# Patient Record
Sex: Male | Born: 1960 | Race: White | Hispanic: No | Marital: Married | Smoking: Never smoker
Health system: Southern US, Community
[De-identification: ages and names within clinical notes are randomized; demographics above are authoritative.]

## PROBLEM LIST (undated history)

## (undated) DIAGNOSIS — B019 Varicella without complication: Secondary | ICD-10-CM

## (undated) DIAGNOSIS — K219 Gastro-esophageal reflux disease without esophagitis: Secondary | ICD-10-CM

## (undated) DIAGNOSIS — G43909 Migraine, unspecified, not intractable, without status migrainosus: Secondary | ICD-10-CM

## (undated) DIAGNOSIS — H9190 Unspecified hearing loss, unspecified ear: Secondary | ICD-10-CM

## (undated) DIAGNOSIS — B029 Zoster without complications: Secondary | ICD-10-CM

## (undated) DIAGNOSIS — N200 Calculus of kidney: Secondary | ICD-10-CM

## (undated) DIAGNOSIS — N400 Enlarged prostate without lower urinary tract symptoms: Secondary | ICD-10-CM

## (undated) DIAGNOSIS — K439 Ventral hernia without obstruction or gangrene: Secondary | ICD-10-CM

## (undated) DIAGNOSIS — G8929 Other chronic pain: Secondary | ICD-10-CM

## (undated) HISTORY — DX: Zoster without complications: B02.9

## (undated) HISTORY — DX: Migraine, unspecified, not intractable, without status migrainosus: G43.909

## (undated) HISTORY — DX: Gastro-esophageal reflux disease without esophagitis: K21.9

## (undated) HISTORY — PX: BACK SURGERY: SHX140

## (undated) HISTORY — PX: LUMBAR DISC SURGERY: SHX700

## (undated) HISTORY — DX: Unspecified hearing loss, unspecified ear: H91.90

## (undated) HISTORY — PX: SPINE SURGERY: SHX786

## (undated) HISTORY — DX: Benign prostatic hyperplasia without lower urinary tract symptoms: N40.0

## (undated) HISTORY — DX: Calculus of kidney: N20.0

## (undated) HISTORY — DX: Varicella without complication: B01.9

## (undated) HISTORY — DX: Ventral hernia without obstruction or gangrene: K43.9

---

## 2002-02-09 ENCOUNTER — Ambulatory Visit (HOSPITAL_COMMUNITY): Admission: RE | Admit: 2002-02-09 | Discharge: 2002-02-10 | Payer: Self-pay | Admitting: Neurosurgery

## 2004-07-13 ENCOUNTER — Ambulatory Visit: Payer: Self-pay | Admitting: Family Medicine

## 2004-07-31 ENCOUNTER — Ambulatory Visit: Payer: Self-pay | Admitting: Internal Medicine

## 2006-05-10 ENCOUNTER — Ambulatory Visit: Payer: Self-pay | Admitting: Family Medicine

## 2006-05-10 LAB — CONVERTED CEMR LAB
ALT: 16 units/L (ref 0–53)
AST: 16 units/L (ref 0–37)
Albumin: 4.8 g/dL (ref 3.5–5.2)
Alkaline Phosphatase: 75 units/L (ref 39–117)
BUN: 15 mg/dL (ref 6–23)
Basophils Absolute: 0 10*3/uL (ref 0.0–0.1)
Basophils Relative: 0 % (ref 0–1)
Bilirubin, Direct: 0.1 mg/dL (ref 0.0–0.3)
CO2: 25 meq/L (ref 19–32)
Calcium: 9.4 mg/dL (ref 8.4–10.5)
Chloride: 102 meq/L (ref 96–112)
Cholesterol: 143 mg/dL (ref 0–200)
Creatinine, Ser: 0.97 mg/dL (ref 0.40–1.50)
Eosinophils Absolute: 0.1 10*3/uL (ref 0.0–0.7)
Eosinophils Relative: 1 % (ref 0–5)
Glucose, Bld: 87 mg/dL (ref 70–99)
HCT: 44.9 % (ref 39.0–52.0)
HDL: 44 mg/dL (ref >39)
Hemoglobin: 15.5 g/dL (ref 13.0–17.0)
Indirect Bilirubin: 0.5 mg/dL (ref 0.0–0.9)
LDL Cholesterol: 84 mg/dL (ref 0–99)
Lymphocytes Relative: 27 % (ref 12–46)
Lymphs Abs: 1.8 10*3/uL (ref 0.7–3.3)
MCHC: 34.5 g/dL (ref 30.0–36.0)
MCV: 96.1 fL (ref 78.0–100.0)
Monocytes Absolute: 0.8 10*3/uL — ABNORMAL HIGH (ref 0.2–0.7)
Monocytes Relative: 13 % — ABNORMAL HIGH (ref 3–11)
Neutro Abs: 3.8 10*3/uL (ref 1.7–7.7)
Neutrophils Relative %: 59 % (ref 43–77)
PSA: 0.57 ng/mL (ref 0.10–4.00)
Platelets: 224 10*3/uL (ref 150–400)
Potassium: 4.3 meq/L (ref 3.5–5.3)
RBC: 4.67 M/uL (ref 4.22–5.81)
RDW: 12.6 % (ref 11.5–14.0)
Sodium: 141 meq/L (ref 135–145)
TSH: 1.983 microintl units/mL (ref 0.350–5.50)
Total Bilirubin: 0.6 mg/dL (ref 0.3–1.2)
Total CHOL/HDL Ratio: 3.3
Total Protein: 7.9 g/dL (ref 6.0–8.3)
Triglycerides: 74 mg/dL (ref <150)
VLDL: 15 mg/dL (ref 0–40)
WBC: 6.5 10*3/uL (ref 4.0–10.5)

## 2006-05-13 ENCOUNTER — Encounter: Payer: Self-pay | Admitting: Family Medicine

## 2006-05-13 LAB — CONVERTED CEMR LAB: PSA: 0.57 ng/mL

## 2006-05-22 ENCOUNTER — Encounter: Payer: Self-pay | Admitting: Family Medicine

## 2006-09-11 ENCOUNTER — Ambulatory Visit: Payer: Self-pay | Admitting: Family Medicine

## 2006-09-11 DIAGNOSIS — Z87442 Personal history of urinary calculi: Secondary | ICD-10-CM

## 2006-09-11 DIAGNOSIS — L255 Unspecified contact dermatitis due to plants, except food: Secondary | ICD-10-CM

## 2006-09-11 DIAGNOSIS — N400 Enlarged prostate without lower urinary tract symptoms: Secondary | ICD-10-CM | POA: Insufficient documentation

## 2009-08-29 ENCOUNTER — Encounter (INDEPENDENT_AMBULATORY_CARE_PROVIDER_SITE_OTHER): Payer: Self-pay | Admitting: *Deleted

## 2010-02-21 NOTE — Letter (Signed)
Summary: Richard Salazar letter  Richard Salazar at [facility]  [address] [place], [place] [address]   Phone: [phone]  Fax: [phone]       08/29/2009 MRN: [id number]  [facility] [address] [place], [place]  [address]  Dear Mr. Richard Salazar [facility] [place], and [place] announce the retirement of Richard Salazar, M.D., from full-time practice at the [facility] office effective July 21, 2009 and his plans of returning part-time.  It is important to Richard Salazar and to our practice that you understand that Richard Salazar, Richard Salazar has seven physicians in our office for your health care needs.  We will continue to offer the same exceptional care that you have today.    Richard Salazar has spoken to many of you about his plans for retirement and returning part-time in the fall.   We will continue to work with you through the transition to schedule appointments for you in the office and meet the high standards that Richard Salazar is committed to.   Again, it is with great pleasure that we share the news that Richard Salazar will return to [facility] [place] [facility] in October of 2011 with a reduced schedule.    If you have any questions, or would like to request an appointment with one of our physicians, please call us at [phone] and press the option for Scheduling an appointment.  We take pleasure in providing you with excellent patient care and look forward to seeing you at your next office visit.  Our [place] [facility] Physicians are:  Richard Salazar, M.D. Richard Salazar, M.D. Richard Salazar, M.D. Richard Salazar, M.D. Richard Salazar, M.D. Richard Salazar, M.D. We proudly welcomed Richard Salazar, M.D. and Richard Salazar, M.D. to the practice in July/August 2011.  Sincerely,  Richard [facility]azar [facility]

## 2010-06-09 NOTE — Op Note (Signed)
NAME:  Richard Salazar, Richard Salazar NO.:  [id number]   MEDICAL RECORD NO.:  [id number]                   PATIENT TYPE:  OIB   LOCATION:  3009                                 FACILITY:  [facility]   PHYSICIAN:  Cristi Loron, M.D.            DATE OF BIRTH:  [birth year]   DATE OF PROCEDURE:  02/09/2002  DATE OF DISCHARGE:                                 OPERATIVE REPORT   BRIEF HISTORY:  The patient is a 49 year old white male who has suffered  from back and right leg pain.  He failed medical management and was worked  up with a lumbar MRI which demonstrated a large herniated disk at L4-5 on  the right.  The patient's signs and symptoms and physical exam were  consistent with a right L4 and L5 radiculopathy.  I discussed the various  treatment options with the patient including surgery.  The patient weighed  the risks, benefits, and alternatives to surgery and decided to proceed with  the operation.   PREOPERATIVE DIAGNOSES:  1. 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  2. Stenosis.  3. Degenerative disease.  4. Lumbar radiculopathy.  5. Lumbago.   POSTOPERATIVE DIAGNOSES:  1. 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  2. Stenosis.  3. Degenerative disease.  4. Lumbar radiculopathy.  5. Lumbago.   PROCEDURE:  Right L4 hemilaminectomy for right L4-5 microdiskectomy using  microdissection.   SURGEON:  Cristi Loron, M.D.   ASSISTANT:  None.   ANESTHESIA:  General endotracheal.   ESTIMATED BLOOD LOSS:  Minimal.   SPECIMENS:  None.   DRAINS:  None.   COMPLICATIONS:  None.   DESCRIPTION OF PROCEDURE:  The patient was brought to the operating room by  ICU team.  General endotracheal anesthesia was induced.  The patient was  then turned to the prone position on the Wilson frame.  His lumbosacral  region had been prepared with Betadine scrub and Betadine solution.  Sterile  drapes were applied.  I then injected the opening sides with Marcaine with  epinephrine solution.  I used a scalpel to make a linear midline incision  over the L4-5 interspace.  I used electrocautery to dissect down to the  thoracolumbar fascia and then divided the fascia just to the right of  midline performing a right sciatic subperiosteal dissection stripped to the  paraspinous musculature to the reverse spinous process to the lamina of L4  and L5.  I inserted the McCauley rectractor for exposure and then obtained  intraoperative radiography for primary location.   We then felt brought the operating microscope into the field and under its  magnification and illumination, completed the microdissection/decompression.  I used the high-speed drill to perform a right L4 laminotomy.  I widened the  laminotomy with a crescent punch, completing a right L4 hemilaminectomy.  I  ruled the right L3-4 and L4-5 fragment with the crescent punch, giving wide  exposure to the thecal sac.  I then  performed a foraminotomy about the right  L5 and L4 nerve root.  I then carefully dissected through the epidural  tissue freeing up the thecal sac and the right L4 and L5 nerve roots  underneath the thecal sac where the huge retrodisk which had migrated in a  cephalad direction from the L4-5 intervertebral disk space.  I freed up this  large herniated disk with microdissectors, and removed it with the pituitary  forceps in several large fragments.  It is currently compressed at proximal  thecal sac and the exiting right L4 nerve root.  I then inspected the right  L4-5 nerve root and the vertebral disk.  There was bulging considerably, but  there was no significant neural compression.  I inspected the annulus.  There was a hole in the annulus, somewhat medially, but there were no  impending disk herniations and therefore, I did not violate the annulus  fibrosis and I did not enter into the interspace.  I then palpated along the  ventral surface of the thecal sac and along the __________  of the L5 and L4  nerve roots, noting that the neural structures were well decompressed.  I  achieved extremely good hemostasis using bipolar electrocautery.  I  copiously irrigated the wound out with bacitracin solution, removed the  solution and then removed the McCullough retractor and then reapproximated  the patient's thoracolumbar fascia with interrupted #1 Vicryl suture  subcutaneously 2-0 Vicryl suture and the skin was steri-stripped and  Benzoin.  The wound was then coated with Bacitracin ointment.  A sterile  dressing applied.  The drains were removed.  The patient was subsequently  returned to the supine position where he was extubated by the anesthesia  team and transported to the postanesthesia care unit in stable condition.  All sponge,  instrument, and needle counts are correct at the end of case.                                               Cristi Loron, M.D.    JDJ/MEDQ  D:  02/09/2002  T:  02/09/2002  Job:  [id number]

## 2011-06-29 ENCOUNTER — Emergency Department: Payer: Self-pay | Admitting: Emergency Medicine

## 2011-06-29 LAB — TROPONIN I: Troponin-I: <0.02 ng/mL

## 2011-06-29 LAB — COMPREHENSIVE METABOLIC PANEL
Albumin: 4.1 g/dL (ref 3.4–5.0)
Anion Gap: 9 (ref 7–16)
BUN: 15 mg/dL (ref 7–18)
Bilirubin,Total: 0.4 mg/dL (ref 0.2–1.0)
Calcium, Total: 9.1 mg/dL (ref 8.5–10.1)
Chloride: 106 mmol/L (ref 98–107)
Creatinine: 0.95 mg/dL (ref 0.60–1.30)
EGFR (Non-African Amer.): >60
Glucose: 89 mg/dL (ref 65–99)
Osmolality: 285 (ref 275–301)
Potassium: 4 mmol/L (ref 3.5–5.1)
SGPT (ALT): 20 U/L
Sodium: 143 mmol/L (ref 136–145)
Total Protein: 7.9 g/dL (ref 6.4–8.2)

## 2011-06-29 LAB — CBC
HGB: 14.9 g/dL (ref 13.0–18.0)
Platelet: 183 10*3/uL (ref 150–440)

## 2011-06-29 LAB — CK TOTAL AND CKMB (NOT AT ARMC): CK-MB: 0.5 ng/mL (ref 0.5–3.6)

## 2014-04-07 ENCOUNTER — Ambulatory Visit (INDEPENDENT_AMBULATORY_CARE_PROVIDER_SITE_OTHER): Payer: No Typology Code available for payment source | Admitting: Primary Care

## 2014-04-07 ENCOUNTER — Encounter: Payer: Self-pay | Admitting: Primary Care

## 2014-04-07 VITALS — BP 134/86 | HR 88 | Temp 98.7°F | Ht 68.0 in | Wt 189.4 lb

## 2014-04-07 DIAGNOSIS — M25511 Pain in right shoulder: Secondary | ICD-10-CM

## 2014-04-07 DIAGNOSIS — Z1211 Encounter for screening for malignant neoplasm of colon: Secondary | ICD-10-CM | POA: Diagnosis not present

## 2014-04-07 DIAGNOSIS — R51 Headache: Secondary | ICD-10-CM

## 2014-04-07 DIAGNOSIS — R21 Rash and other nonspecific skin eruption: Secondary | ICD-10-CM | POA: Diagnosis not present

## 2014-04-07 DIAGNOSIS — J3489 Other specified disorders of nose and nasal sinuses: Secondary | ICD-10-CM | POA: Diagnosis not present

## 2014-04-07 DIAGNOSIS — R519 Headache, unspecified: Secondary | ICD-10-CM | POA: Insufficient documentation

## 2014-04-07 MED ORDER — FLUTICASONE PROPIONATE 50 MCG/ACT NA SUSP: 2.0000 | Freq: Every day | NASAL | Status: DC

## 2014-04-07 NOTE — Assessment & Plan Note (Signed)
Present with adduction and abduction. Referred to Dr. Lorelei Pont for further evaluation and for possible physical therapy.

## 2014-04-07 NOTE — Progress Notes (Signed)
Pre visit review using our clinic review tool, if applicable. No additional management support is needed unless otherwise documented below in the visit note. 

## 2014-04-07 NOTE — Assessment & Plan Note (Signed)
Intermittently. Clear. 6-7 drops. RX for Flonase, recommended Claritin/Zyrtec. Will continue to monitor.

## 2014-04-07 NOTE — Assessment & Plan Note (Signed)
Stable, likely due to eye strain due to daily encounter with computer screen. Will be getting new RX for eye glasses which is likely the cause. Will continue to monitor.

## 2014-04-07 NOTE — Assessment & Plan Note (Signed)
Raised area with erythema. This is in the same location for which a mole was removed. Questionable appearance. Patient and wife would like referral to Dermatology. Referral made. Will continue to monitor.

## 2014-04-07 NOTE — Patient Instructions (Signed)
Take Claritin or Zyrtec for allergies and nasal drainage. Use Flonase nasal spray once daily for nasal drainage. You will be contacted regarding your referrals to Dermatology and Sports Medicine. Call me if you have any questions! Welcome to [facility]!

## 2014-04-07 NOTE — Progress Notes (Signed)
Subjective:    Patient ID: Richard Salazar, male    DOB: 1960-09-14, 54 y.o.   MRN: [id number]  HPI  [name] is a 54 year old male who presents today to establish care and with a chief complaint of nasal drainage.   1) Nasal drainage: He had an URI over the past week which has now mostly dissipated. This previous weekend he began feeling nauseated with dizziness, and started noticing rhinorrhea on Monday. The rhinorrhea is a sudden onset of drainage, particularly when he leans forward or turns his head laterally. The drainage is intermittent and mostly clear and drains from his left nostril. He will typically see about 6-7 drops before it will dissipate, but has seen at most a tablespoons amount.  On Monday morning he noticed that part of his pillow was wet due to the nasal discharge.  Neither he or his wife have not noticed any neuro changes, bleeding, or recent trauma. Monday night he did have a left sided migraine headache.  2) BPH: Diagnosed 10 years ago. Had frequency of urination at night. He's had no problems since. Not on any medications.  3) Shoulder pain: Right sided.pain for 1.5 years. He noticed this pain when he worked out pretty aggressively with weights. The pain is dull and achy with most movement but is much worse if he makes a jarring or sudden movement. Denies numbness/tingling. Doesn't take any medication, including OTC, for his pain.  4) Hip/Leg pain: Right sided, had discectomy due to bulging disc 12 years ago which may have been to L5 or L4, but he's uncertain. He will have moderate to severe pain if his leg "cuts a corner too sharp" or if he has a sudden change of direction. Denies numbness/tingling, but occasional weakness. Doesn't take any medication, including OTC, for his pain.  5) Headaches: Frequent and will them every 2-3 days. His occupation requires him to look at a computer screen daily for which he will strain his eyes. He has a new prescription for his  glasses and plans to update them soon. He doesn't typically take anything for his headaches, but will occasionally take Excedrin migraine which will east his pain. He was on daily medication in college.Took some medication in collage.   6) GERD: History of reflux with cough and was treated with Nexium. Symptoms resolved and was able to come off. No problems to date.  Review of Systems  Constitutional: Negative for fever and unexpected weight change.  HENT: Positive for postnasal drip and rhinorrhea. Negative for sore throat.   Eyes: Negative for itching.  Respiratory: Negative for cough and shortness of breath.   Cardiovascular: Negative for chest pain and leg swelling.  Gastrointestinal: Negative for abdominal pain, diarrhea and constipation.  Genitourinary: Negative for dysuria and frequency.  Musculoskeletal: Positive for arthralgias. Negative for back pain.  Skin: Negative for rash.  Neurological: Positive for headaches.  Hematological: Negative for adenopathy.  Psychiatric/Behavioral:       Denies anxiety and depression.        Past Medical History  Diagnosis Date  . BPH (benign prostatic hyperplasia)   . Deafness     Left ear, loss  . GERD (gastroesophageal reflux disease)   . Shingles     15 years ago    History   Social History  . Marital Status: Single    Spouse Name: N/A  . Number of Children: N/A  . Years of Education: N/A   Occupational History  . Not on  file.   Social History Main Topics  . Smoking status: Never Smoker   . Smokeless tobacco: Not on file  . Alcohol Use: 0.0 oz/week    0 Standard drinks or equivalent per week     Comment: occ  . Drug Use: No  . Sexual Activity: Not on file   Other Topics Concern  . Not on file   Social History Narrative   Married, 12 years.   One daughter age 5.    Works at [employer]   Enjoys play piano, banjo, sports.    Past Surgical History  Procedure Laterality Date  . Lumbar disc surgery      2004     Family History  Problem Relation Age of Onset  . Alzheimer's disease Mother   . Hypertension Father   . Heart disease Father     Stent placement  . Crohn's disease Brother     No Known Allergies  No current outpatient prescriptions on file prior to visit.   No current facility-administered medications on file prior to visit.    BP 134/86 mmHg  Pulse 88  Temp(Src) 98.7 F (37.1 C) (Oral)  Ht 5\' 8"  (1.727 m)  Wt 189 lb 6.4 oz (85.911 kg)  BMI 28.80 kg/m2  SpO2 96%    Objective:   Physical Exam  Constitutional: He is oriented to person, place, and time. He appears well-developed.  HENT:  Head: Normocephalic.  Right Ear: External ear normal.  Left Ear: External ear normal.  Nose: Nose normal.  Mouth/Throat: Oropharynx is clear and moist.  Eyes: Conjunctivae are normal. Pupils are equal, round, and reactive to light.  Neck: Neck supple. No thyromegaly present.  Cardiovascular: Normal rate and regular rhythm.   Pulmonary/Chest: Effort normal and breath sounds normal.  Abdominal: Soft. Bowel sounds are normal. He exhibits no mass. There is no tenderness.  Musculoskeletal:  Right shoulder pain to abduction and adduction. Negative straight leg raise bilaterally.  Lymphadenopathy:    He has no cervical adenopathy.  Neurological: He is alert and oriented to person, place, and time. He displays normal reflexes. No cranial nerve deficit. Coordination normal.  Skin: Skin is warm and dry. Rash noted.  Re-growth of red tissue from where a mole was once removed.   Psychiatric: He has a normal mood and affect.          Assessment & Plan:  >45 minutes spent face to face with patient, >50% spent counseling or coordinating care.

## 2014-04-19 ENCOUNTER — Encounter: Payer: Self-pay | Admitting: Family Medicine

## 2014-04-19 ENCOUNTER — Ambulatory Visit (INDEPENDENT_AMBULATORY_CARE_PROVIDER_SITE_OTHER): Payer: No Typology Code available for payment source | Admitting: Family Medicine

## 2014-04-19 ENCOUNTER — Ambulatory Visit (INDEPENDENT_AMBULATORY_CARE_PROVIDER_SITE_OTHER)
Admission: RE | Admit: 2014-04-19 | Discharge: 2014-04-19 | Disposition: A | Discharge status: Home / Self Care | Payer: No Typology Code available for payment source | Source: Ambulatory Visit | Attending: Family Medicine | Admitting: Family Medicine

## 2014-04-19 VITALS — BP 108/80 | HR 79 | Temp 97.4°F | Ht 68.0 in | Wt 192.1 lb

## 2014-04-19 DIAGNOSIS — M7541 Impingement syndrome of right shoulder: Secondary | ICD-10-CM

## 2014-04-19 DIAGNOSIS — M25551 Pain in right hip: Secondary | ICD-10-CM

## 2014-04-19 DIAGNOSIS — M7581 Other shoulder lesions, right shoulder: Secondary | ICD-10-CM | POA: Diagnosis not present

## 2014-04-19 NOTE — Progress Notes (Signed)
Dr. Frederico Hamman T. Miami Latulippe, MD, [facility] [place], [address] Phone: [phone] Fax: [phone]  04/19/2014  Patient: Richard Salazar, MRN: [id number], DOB: 1960-10-04, 54 y.o.  Primary Physician:  Sheral Flow, NP  Chief Complaint: Leg Pain and Arm Pain  Subjective:   Richard Salazar is a 54 y.o. very pleasant male patient who presents with the following:  Consulting Provider: Mrs. Carlis Abbott Reason: Shoulder and hip pain  Very pleasant gentleman who is been having R shoulder pain for approximately 18 months.  Right now he is having significant pain in abduction and internal range of motion.  His pain is so significant that he cannot throw a softball, baseball, or anything at all, and when he tries to do such she has "11 out of 10 pain."  He has had no traumatic dislocation, fracture, or prior operative intervention in the affected shoulder.  He is right-hand dominant.  This is been an indolent course, and escalated over time.  He was lifting weights, and particularly overhead movements such as military presses, bench presses, an incline presses as well as flies hurt quite a bit.  Other movements did not hurt.  He has taken significant amounts of time off without much success.  NSAIDs have not helped at all.  He has not done any formal rehabilitation at all.  He is not having any significant neck pain or radiculopathy.  He does have pain in a T-shirt distribution.  Sometimes this does wake him up at night.  He also intermittently has some pain on the anterior of his R hip, but this is not really painful at all today.  He has not had any leg or hip injury.  His movement is quite normal on the right side.  He is not having any lateral hip pain and is not having any pain in his lower back at all.  Past Medical History, Surgical History, Social History, Family History, Problem List, Medications, and Allergies have been reviewed  and updated if relevant.  GEN: No fevers, chills. Nontoxic. Primarily MSK c/o today. MSK: Detailed in the HPI GI: tolerating PO intake without difficulty Neuro: No numbness, parasthesias, or tingling associated. Otherwise, the pertinent positives and negatives are listed above and in the HPI, otherwise a full review of systems has been reviewed and is negative unless noted positive.   Objective:   BP 108/80 mmHg  Pulse 79  Temp(Src) 97.4 F (36.3 C) (Oral)  Ht 5\' 8"  (1.727 m)  Wt 192 lb 1.9 oz (87.145 kg)  BMI 29.22 kg/m2  SpO2 98%   GEN: Well-developed,well-nourished,in no acute distress; alert,appropriate and cooperative throughout examination HEENT: Normocephalic and atraumatic without obvious abnormalities. Ears, externally no deformities PULM: Breathing comfortably in no respiratory distress EXT: No clubbing, cyanosis, or edema PSYCH: Normally interactive. Cooperative during the interview. Pleasant. Friendly and conversant. Not anxious or depressed appearing. Normal, full affect.  Shoulder: B Inspection: No muscle wasting or winging Ecchymosis/edema: neg  AC joint, scapula, clavicle: NT Cervical spine: NT, full ROM Spurling's: neg Abduction: full, 5/5, painful arc Flexion: full, 5/5 IR, full, lift-off: 5/5, painful. Limited to 20 deg IROM at 90 deg abd ER at neutral: full, 5/5 AC crossover: neg Neer: pos Hawkins: pos Drop Test: neg Empty Can: pos Supraspinatus insertion: mild-mod T Bicipital groove: NT Speed's: neg Yergason's: neg O'BRIEN's POSITIVE Sulcus sign: neg Scapular dyskinesis: no winging, B increased scapular play with pushups C5-T1 intact  Neuro: Sensation intact  Grip 5/5   HIP EXAM: SIDE: B ROM: Abduction, Flexion, Internal and External range of motion: full Pain with terminal IROM and EROM: no GTB: NT SLR: NEG Knees: No effusion FABER: NT REVERSE FABER: NT, neg Piriformis: NT at direct palpation Str: flexion: 5/5 abduction:  5/5 adduction: 5/5 Strength testing non-tender  Radiology: Dg Shoulder Right  04/19/2014   CLINICAL DATA:  Chronic progressive right shoulder soreness  EXAM: RIGHT SHOULDER - 2+ VIEW  COMPARISON:  None.  FINDINGS: The bones of the shoulder are adequately mineralized. There is no acute or old fracture nor evidence of dislocation. There is no significant bony degenerative change. The observed portions of the right clavicle and upper right ribs are normal.  IMPRESSION: There is no acute or significant chronic bony abnormality of the right shoulder.   Electronically Signed   By: David  Martinique   On: 04/19/2014 13:38     Assessment and Plan:   Shoulder impingement, right - Plan: Ambulatory referral to Physical Therapy, DG Shoulder Right  Rotator cuff tendonitis, right - Plan: Ambulatory referral to Physical Therapy, DG Shoulder Right  Right hip pain  Classic impingement with altered mechanics, no OA, RTC tendinopathy, probably subac bursitis. 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 Reviewed posterior capsule stretches for daily use.   Entire shoulder girdle needs to be rehabbed over time, first formal PT, and home rehab along the way.  PRN NSAIDS or Tylenol for pain only  No intraarticular hip pathology. Asymptomatic today - no concerns.  I appreciate the opportunity to evaluate this very friendly patient. If you have any question regarding his care or prognosis, do not hesitate to ask.   Follow-up: Return in about 2 months (around 06/19/2014).  Orders Placed This Encounter  Procedures  . DG Shoulder Right  . Ambulatory referral to Physical Therapy    Signed,  Frederico Hamman T. Hitomi Slape, MD   Patient's Medications  New Prescriptions   No medications on file  Previous Medications   FLUTICASONE (FLONASE) 50 MCG/ACT NASAL SPRAY    Place 2 sprays into both nostrils daily.   MULTIPLE VITAMIN (MULTIVITAMIN)  CAPSULE    Take 1 capsule by mouth daily.  Modified Medications   No medications on file  Discontinued Medications   No medications on file

## 2014-04-19 NOTE — Progress Notes (Signed)
Pre visit review using our clinic review tool, if applicable. No additional management support is needed unless otherwise documented below in the visit note. 

## 2014-04-19 NOTE — Patient Instructions (Signed)

## 2014-05-06 ENCOUNTER — Ambulatory Visit (INDEPENDENT_AMBULATORY_CARE_PROVIDER_SITE_OTHER): Payer: No Typology Code available for payment source | Admitting: Primary Care

## 2014-05-06 ENCOUNTER — Encounter: Payer: Self-pay | Admitting: Primary Care

## 2014-05-06 VITALS — BP 116/78 | HR 77 | Temp 98.0°F | Ht 68.0 in | Wt 193.8 lb

## 2014-05-06 DIAGNOSIS — R7303 Prediabetes: Secondary | ICD-10-CM

## 2014-05-06 DIAGNOSIS — Z0000 Encounter for general adult medical examination without abnormal findings: Secondary | ICD-10-CM | POA: Diagnosis not present

## 2014-05-06 DIAGNOSIS — R51 Headache: Secondary | ICD-10-CM

## 2014-05-06 DIAGNOSIS — R635 Abnormal weight gain: Secondary | ICD-10-CM | POA: Diagnosis not present

## 2014-05-06 DIAGNOSIS — R6889 Other general symptoms and signs: Secondary | ICD-10-CM | POA: Diagnosis not present

## 2014-05-06 DIAGNOSIS — R519 Headache, unspecified: Secondary | ICD-10-CM

## 2014-05-06 DIAGNOSIS — R21 Rash and other nonspecific skin eruption: Secondary | ICD-10-CM

## 2014-05-06 DIAGNOSIS — E663 Overweight: Secondary | ICD-10-CM

## 2014-05-06 DIAGNOSIS — R7309 Other abnormal glucose: Secondary | ICD-10-CM | POA: Diagnosis not present

## 2014-05-06 DIAGNOSIS — J3489 Other specified disorders of nose and nasal sinuses: Secondary | ICD-10-CM

## 2014-05-06 DIAGNOSIS — M25511 Pain in right shoulder: Secondary | ICD-10-CM

## 2014-05-06 DIAGNOSIS — E669 Obesity, unspecified: Secondary | ICD-10-CM | POA: Insufficient documentation

## 2014-05-06 LAB — VITAMIN B12: Vitamin B-12: 490 pg/mL (ref 211–911)

## 2014-05-06 LAB — FOLATE

## 2014-05-06 LAB — HEMOGLOBIN A1C: Hgb A1c MFr Bld: 5.5 % (ref 4.6–6.5)

## 2014-05-06 NOTE — Assessment & Plan Note (Addendum)
Remains present, denies pain or itching. Has an appointment on 4/22 with Dr. Marolyn Hammock per patient request.

## 2014-05-06 NOTE — Progress Notes (Signed)
Subjective:    Patient ID: Richard Salazar, male    DOB: [birth year], 54 y.o.   MRN: [id number]  HPI  Richard Salazar is a 54 year old male who presents today for complete physical.  Immunizations: -Tetanus: Had vaccine in 2008. -Influenza: Had flu vaccine last season  Diet: Endorses a healthy diet overall. Example: coffee and applesauce for breakfast, pistachios for a snack, sandwich/soup and yogurt for lunch, baked/broiled lean meat with vegetables for dinner. He's cut down on his intake of pasta, white rice, and bread. Regularly eats a lot of vegetables and fruits. Does not drink sodas or beer. Drinks mainly water, coffee with cream and sugar daily. He's very frustrated with his lack of weight loss despite his efforts to improve his diet over the years. He would like referral to a surgeon to discuss his abdominal girth. Body mass index is 29.47 kg/(m^2).  Exercise: Not exercising due to right shoulder injury. Plans on taking up walking. Colonoscopy: Has no had yet. Talk to [facility]. PSA: Unsure of last, probably more than 5 years ago.  1) Right shoulder pain: Met with Dr. Lorelei Salazar and is on physical therapy twice weekly. At [facility] therapy, doesn't think it's a cuff tear.   2) Forgetfulness: First noticed this a couple of years ago. He has difficulty remembering specifics of long term memory, can't remember how to get to specific places he's visited in the past as easily as he had prior.. Doesn't lose his belongings or place his items in odd places. He does admit to stress with work over the past 2 years but reports he's and is in a better position and doesn't feel stressed. He does have some anxiety about work, and has had some family stress. PHQ-9 score of 9. Thinks he's apathetic due to his  weight and outward appearance.   Body mass index is 29.47 kg/(m^2).  Wt Readings from Last 3 Encounters:  05/06/14 193 lb 12.8 oz (87.907 kg)  04/19/14 192 lb 1.9 oz (87.145 kg)  04/07/14 189 lb 6.4  oz (85.911 kg)   Review of Systems  HENT: Negative for rhinorrhea.   Respiratory: Negative for cough and shortness of breath.   Cardiovascular: Negative for chest pain.  Gastrointestinal: Negative for diarrhea and constipation.  Genitourinary: Negative for dysuria, urgency, frequency and difficulty urinating.  Skin: Positive for rash.       Appointment with dermatology 05/14/14  Allergic/Immunologic:       Seasonal allergies  Neurological: Negative for dizziness.       Occasional headaches  Psychiatric/Behavioral:       PHQ-9 score of 9.       Past Medical History  Diagnosis Date  . BPH (benign prostatic hyperplasia)   . Deafness     Left ear, loss  . GERD (gastroesophageal reflux disease)   . Shingles     15 years ago  . Kidney stones   . Migraines   . Chicken pox     History   Social History  . Marital Status: Single    Spouse Name: N/A  . Number of Children: N/A  . Years of Education: N/A   Occupational History  . Not on file.   Social History Main Topics  . Smoking status: Never Smoker   . Smokeless tobacco: Not on file  . Alcohol Use: 0.0 oz/week    0 Standard drinks or equivalent per week     Comment: occ  . Drug Use: No  . Sexual Activity: Not  on file   Other Topics Concern  . Not on file   Social History Narrative   Married, 12 years.   One daughter age 31.    Works at [employer]   Enjoys play piano, banjo, sports.    Past Surgical History  Procedure Laterality Date  . Lumbar disc surgery      2004  . Spine surgery      Family History  Problem Relation Age of Onset  . Alzheimer's disease Mother   . Hypertension Father   . Heart disease Father     Stent placement  . Crohn's disease Brother     No Known Allergies  Current Outpatient Prescriptions on File Prior to Visit  Medication Sig Dispense Refill  . fluticasone (FLONASE) 50 MCG/ACT nasal spray Place 2 sprays into both nostrils daily. 16 g 6  . Multiple Vitamin (MULTIVITAMIN)  capsule Take 1 capsule by mouth daily.     No current facility-administered medications on file prior to visit.    BP 116/78 mmHg  Pulse 77  Temp(Src) 98 F (36.7 C) (Oral)  Ht '5\' 8"'  (1.727 m)  Wt 193 lb 12.8 oz (87.907 kg)  BMI 29.47 kg/m2  SpO2 98%    Objective:   Physical Exam  Constitutional: He is oriented to person, place, and time. He appears well-developed.  HENT:  Head: Normocephalic.  Right Ear: External ear normal.  Left Ear: External ear normal.  Nose: Nose normal.  Mouth/Throat: Oropharynx is clear and moist.  Eyes: EOM are normal. Pupils are equal, round, and reactive to light.  Neck: Neck supple. No thyromegaly present.  Cardiovascular: Normal rate and regular rhythm.   Pulmonary/Chest: Effort normal and breath sounds normal.  Abdominal: Soft. Bowel sounds are normal. There is no tenderness.  Lymphadenopathy:    He has no cervical adenopathy.  Neurological: He is alert and oriented to person, place, and time. He has normal reflexes. No cranial nerve deficit.  Skin: Skin is warm and dry.  Psychiatric: He has a normal mood and affect.          Assessment & Plan:

## 2014-05-06 NOTE — Progress Notes (Signed)
Pre visit review using our clinic review tool, if applicable. No additional management support is needed unless otherwise documented below in the visit note. 

## 2014-05-06 NOTE — Patient Instructions (Addendum)
Complete lab work prior to leaving today. I will notify you of your results. You will be contacted regarding your referral to Plastic Surgery. Continue your efforts to maintain a healthy diet. Also please attempt to exercise for 30 minutes a day, 5 days a week. Follow up in 2 months for re-evaluation.

## 2014-05-06 NOTE — Assessment & Plan Note (Signed)
Resolved. Taking daily Claritin during the Spring season for prevention.

## 2014-05-06 NOTE — Assessment & Plan Note (Addendum)
Likely related to stress. Vitamin B12, Folate, and RPR, unremarkable. Family history of Alzheimers. Will check on TSH, recently drawn by his occupation. PHQ-9 score of 9. He does not believe he's depressed or anxious. Will continue to monitor and rule out anxiety. Will check MMSE next visit.

## 2014-05-06 NOTE — Assessment & Plan Note (Signed)
Referred to Dr. Lorelei Pont. He's now at physical therapy twice weekly at [place]. He has been told not to exercise with his right arm due to current treatment. May require surgery in the future, still unsure at this point.

## 2014-05-06 NOTE — Assessment & Plan Note (Signed)
Slow weight gain over the years despite healthy diet and previous exercise. Would like to weigh 175, but is 193 today. Would like referral to discuss liposuction. Referral made to plastic surgery. Encouraged to continue his efforts of a healthy diet and to start walking daily for at least 30 min.

## 2014-05-06 NOTE — Assessment & Plan Note (Signed)
Stable and are less frequent. He has not obtained his new eye glasses prescription as of today. Plans on doing this soon.

## 2014-05-07 ENCOUNTER — Other Ambulatory Visit (INDEPENDENT_AMBULATORY_CARE_PROVIDER_SITE_OTHER): Payer: No Typology Code available for payment source

## 2014-05-07 DIAGNOSIS — R635 Abnormal weight gain: Secondary | ICD-10-CM

## 2014-05-07 LAB — RPR

## 2014-05-07 LAB — TSH: TSH: 1.63 u[IU]/mL (ref 0.35–4.50)

## 2014-05-11 ENCOUNTER — Encounter: Payer: Self-pay | Admitting: *Deleted

## 2014-05-11 ENCOUNTER — Telehealth: Payer: Self-pay | Admitting: Primary Care

## 2014-05-11 NOTE — Telephone Encounter (Signed)
Called patient back and notified him of Kate's result note. Patient verbalized understanding.

## 2014-05-11 NOTE — Telephone Encounter (Signed)
Patient returned Chan's call.  Please call him back at work the number is [phone] ext [phone].

## 2014-05-20 ENCOUNTER — Encounter: Payer: Self-pay | Admitting: Primary Care

## 2014-06-17 ENCOUNTER — Encounter: Payer: Self-pay | Admitting: Family Medicine

## 2014-06-17 ENCOUNTER — Ambulatory Visit (INDEPENDENT_AMBULATORY_CARE_PROVIDER_SITE_OTHER): Payer: No Typology Code available for payment source | Admitting: Family Medicine

## 2014-06-17 VITALS — BP 106/72 | HR 85 | Temp 98.2°F | Ht 68.0 in | Wt 191.8 lb

## 2014-06-17 DIAGNOSIS — M25511 Pain in right shoulder: Secondary | ICD-10-CM | POA: Diagnosis not present

## 2014-06-17 DIAGNOSIS — M7541 Impingement syndrome of right shoulder: Secondary | ICD-10-CM | POA: Diagnosis not present

## 2014-06-17 DIAGNOSIS — M7581 Other shoulder lesions, right shoulder: Secondary | ICD-10-CM | POA: Diagnosis not present

## 2014-06-17 MED ORDER — METHYLPREDNISOLONE ACETATE 40 MG/ML IJ SUSP
80.0000 mg | Freq: Once | INTRAMUSCULAR | Status: AC
  Administered 2014-06-17: 80 mg via INTRA_ARTICULAR

## 2014-06-17 NOTE — Patient Instructions (Signed)

## 2014-06-17 NOTE — Progress Notes (Signed)
Pre visit review using our clinic review tool, if applicable. No additional management support is needed unless otherwise documented below in the visit note. 

## 2014-06-17 NOTE — Progress Notes (Signed)
Dr. Frederico Hamman T. Alanis Clift, MD, [facility] [place], [address] Phone: [phone] Fax: [phone]  06/17/2014  Patient: Richard Salazar, MRN: [id number], DOB: [birth year], 54 y.o.  Primary Physician:  Sheral Flow, NP  Chief Complaint: Follow-up  Subjective:   Richard Salazar is a 54 y.o. very pleasant male patient who presents with the following:   R shoulder is not going well. Particularly over the last few days, he has had some pain going all the way to his fingertips and having some problems holding a plate and a fork and will get very weak. No neck pain. He has been very diligent with doing his home exercise program and his physical therapy.  The had PT send him back to me for additional evaluation given his lack of progress.  He basically has not improved at all, and he is still having significant pain and symptoms, pain with abduction, internal range of motion, and he is also having pain at nighttime.  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   R subac injection  04/19/2014 Last OV with Owens Loffler, MD   Consulting Provider: Mrs. Carlis Abbott Reason: Shoulder and hip pain  Very pleasant gentleman who is been having R shoulder pain for approximately 18 months.  Right now he is having significant pain in abduction and internal range of motion.  His pain is so significant that he cannot throw a softball, baseball, or anything at all, and when he tries to do such she has "11 out of 10 pain."  He has had no traumatic dislocation, fracture, or prior operative intervention in the affected shoulder.  He is right-hand dominant.  This is been an indolent course, and escalated over time.  He was lifting weights, and particularly overhead movements such as military presses, bench presses, an incline presses as well as flies hurt quite a bit.  Other movements did not hurt.  He has taken significant  amounts of time off without much success.  NSAIDs have not helped at all.  He has not done any formal rehabilitation at all.  He is not having any significant neck pain or radiculopathy.  He does have pain in a T-shirt distribution.  Sometimes this does wake him up at night.  He also intermittently has some pain on the anterior of his R hip, but this is not really painful at all today.  He has not had any leg or hip injury.  His movement is quite normal on the right side.  He is not having any lateral hip pain and is not having any pain in his lower back at all.  Past Medical History, Surgical History, Social History, Family History, Problem List, Medications, and Allergies have been reviewed and updated if relevant.  GEN: No fevers, chills. Nontoxic. Primarily MSK c/o today. MSK: Detailed in the HPI GI: tolerating PO intake without difficulty Neuro: No numbness, parasthesias, or tingling associated. Otherwise, the pertinent positives and negatives are listed above and in the HPI, otherwise a full review of systems has been reviewed and is negative unless noted positive.   Objective:   BP 106/72 mmHg  Pulse 85  Temp(Src) 98.2 F (36.8 C) (Oral)  Ht 5\' 8"  (1.727 m)  Wt 191 lb 12 oz (86.977 kg)  BMI 29.16 kg/m2   GEN: Well-developed,well-nourished,in no acute distress; alert,appropriate and cooperative throughout examination HEENT: Normocephalic and atraumatic without obvious abnormalities. Ears, externally  no deformities PULM: Breathing comfortably in no respiratory distress EXT: No clubbing, cyanosis, or edema PSYCH: Normally interactive. Cooperative during the interview. Pleasant. Friendly and conversant. Not anxious or depressed appearing. Normal, full affect.  Shoulder: B Inspection: No muscle wasting or winging Ecchymosis/edema: neg  AC joint, scapula, clavicle: NT Cervical spine: NT, full ROM Spurling's: neg Abduction: full, 5/5, painful arc Flexion: full, 5/5 IR, full,  lift-off: 5/5, painful. Limited to 30 deg IROM at 90 deg abd ER at neutral: full, 5/5 AC crossover: neg Neer: pos Hawkins: pos Drop Test: neg Empty Can: pos Supraspinatus insertion: mild-mod T Bicipital groove: NT Speed's: neg Yergason's: neg O'BRIEN's causes pain. But truly negative. Sulcus sign: neg Scapular dyskinesis: no winging C5-T1 intact  Neuro: Sensation intact Grip 5/5   Radiology: CLINICAL DATA: Chronic progressive right shoulder soreness  EXAM: RIGHT SHOULDER - 2+ VIEW  COMPARISON: None.  FINDINGS: The bones of the shoulder are adequately mineralized. There is no acute or old fracture nor evidence of dislocation. There is no significant bony degenerative change. The observed portions of the right clavicle and upper right ribs are normal.  IMPRESSION: There is no acute or significant chronic bony abnormality of the right shoulder.   Electronically Signed  By: David Martinique  On: 04/19/2014 13:38  Assessment and Plan:   Rotator cuff tendonitis, right - Plan: MR Shoulder Right Wo Contrast, methylPREDNISolone acetate (DEPO-MEDROL) injection 80 mg  Right shoulder pain - Plan: MR Shoulder Right Wo Contrast  Shoulder impingement, right - Plan: MR Shoulder Right Wo Contrast  The patient is clearly doing poorly.  He is having severe shoulder pain that is limiting him with most activities.  This is been ongoing now for 20 months, and he has failed conservative management thus far including formal physical therapy, nonsteroidals, home exercise program.  Marya Amsler going to do a trial subacromial injection today. Given that the patient's symptoms are so intense and he is doing so poorly in this timeframe, we'll obtain an MRI of the right shoulder to evaluate the patient's rotator cuff, evaluate impingement pathology as well as the [facility] joint.  Given the patient's timeframe, arthroscopy would be a reasonable consideration.  SubAC Injection, R Verbal consent  was obtained from the patient. Risks (including rare infection), benefits, and alternatives were explained. Patient prepped with Chloraprep and Ethyl Chloride used for anesthesia. The subacromial space was injected using the posterior approach. The patient tolerated the procedure well and had decreased pain post injection. No complications. Injection: 8 cc of Lidocaine 1% and Depo-Medrol 80 mg. Needle: 22 gauge   Orders Placed This Encounter  Procedures  . MR Shoulder Right Wo Contrast    Signed,  Mishawn Didion T. Jafar Poffenberger, MD   Patient's Medications  New Prescriptions   No medications on file  Previous Medications   FLUTICASONE (FLONASE) 50 MCG/ACT NASAL SPRAY    Place 2 sprays into both nostrils daily.   LORATADINE (CLARITIN) 10 MG TABLET    Take 10 mg by mouth daily.   MULTIPLE VITAMIN (MULTIVITAMIN) CAPSULE    Take 1 capsule by mouth daily.   POLYETHYLENE GLYCOL POWDER (GLYCOLAX/MIRALAX) POWDER    Take as directed for colonoscopy prep.  Modified Medications   No medications on file  Discontinued Medications   No medications on file

## 2014-06-23 ENCOUNTER — Ambulatory Visit: Payer: No Typology Code available for payment source | Admitting: Family Medicine

## 2014-06-30 ENCOUNTER — Ambulatory Visit
Admission: RE | Admit: 2014-06-30 | Discharge: 2014-06-30 | Disposition: A | Discharge status: Home / Self Care | Payer: No Typology Code available for payment source | Source: Ambulatory Visit | Attending: Family Medicine | Admitting: Family Medicine

## 2014-06-30 DIAGNOSIS — M7581 Other shoulder lesions, right shoulder: Secondary | ICD-10-CM

## 2014-06-30 DIAGNOSIS — M7541 Impingement syndrome of right shoulder: Secondary | ICD-10-CM

## 2014-06-30 DIAGNOSIS — M12811 Other specific arthropathies, not elsewhere classified, right shoulder: Secondary | ICD-10-CM | POA: Insufficient documentation

## 2014-06-30 DIAGNOSIS — M67911 Unspecified disorder of synovium and tendon, right shoulder: Secondary | ICD-10-CM | POA: Diagnosis not present

## 2014-06-30 DIAGNOSIS — M25811 Other specified joint disorders, right shoulder: Secondary | ICD-10-CM | POA: Insufficient documentation

## 2014-06-30 DIAGNOSIS — M25511 Pain in right shoulder: Secondary | ICD-10-CM | POA: Diagnosis present

## 2014-07-05 ENCOUNTER — Other Ambulatory Visit: Payer: Self-pay | Admitting: Family Medicine

## 2014-07-05 DIAGNOSIS — M7541 Impingement syndrome of right shoulder: Secondary | ICD-10-CM

## 2014-07-09 ENCOUNTER — Encounter: Payer: Self-pay | Admitting: *Deleted

## 2014-07-09 ENCOUNTER — Ambulatory Visit: Payer: No Typology Code available for payment source | Admitting: Anesthesiology

## 2014-07-09 ENCOUNTER — Encounter
Admission: RE | Disposition: A | Discharge status: Home / Self Care | Payer: Self-pay | Source: Ambulatory Visit | Attending: Unknown Physician Specialty

## 2014-07-09 ENCOUNTER — Ambulatory Visit
Admission: RE | Admit: 2014-07-09 | Discharge: 2014-07-09 | Disposition: A | Discharge status: Home / Self Care | Payer: No Typology Code available for payment source | Source: Ambulatory Visit | Attending: Unknown Physician Specialty | Admitting: Unknown Physician Specialty

## 2014-07-09 DIAGNOSIS — Z87442 Personal history of urinary calculi: Secondary | ICD-10-CM | POA: Insufficient documentation

## 2014-07-09 DIAGNOSIS — Z79899 Other long term (current) drug therapy: Secondary | ICD-10-CM | POA: Diagnosis not present

## 2014-07-09 DIAGNOSIS — N289 Disorder of kidney and ureter, unspecified: Secondary | ICD-10-CM | POA: Diagnosis not present

## 2014-07-09 DIAGNOSIS — N400 Enlarged prostate without lower urinary tract symptoms: Secondary | ICD-10-CM | POA: Diagnosis not present

## 2014-07-09 DIAGNOSIS — K648 Other hemorrhoids: Secondary | ICD-10-CM | POA: Diagnosis not present

## 2014-07-09 DIAGNOSIS — Z1211 Encounter for screening for malignant neoplasm of colon: Secondary | ICD-10-CM | POA: Diagnosis present

## 2014-07-09 DIAGNOSIS — K219 Gastro-esophageal reflux disease without esophagitis: Secondary | ICD-10-CM | POA: Insufficient documentation

## 2014-07-09 DIAGNOSIS — R51 Headache: Secondary | ICD-10-CM | POA: Insufficient documentation

## 2014-07-09 DIAGNOSIS — D123 Benign neoplasm of transverse colon: Secondary | ICD-10-CM | POA: Diagnosis not present

## 2014-07-09 HISTORY — PX: COLONOSCOPY: SHX5424

## 2014-07-09 SURGERY — COLONOSCOPY
Anesthesia: General

## 2014-07-09 MED ORDER — PROPOFOL INFUSION 10 MG/ML OPTIME
INTRAVENOUS | Status: DC | PRN
  Administered 2014-07-09: 120 ug/kg/min via INTRAVENOUS

## 2014-07-09 MED ORDER — SODIUM CHLORIDE 0.9 % IV SOLN
INTRAVENOUS | Status: DC
  Administered 2014-07-09 (×2): via INTRAVENOUS

## 2014-07-09 MED ORDER — PROPOFOL 10 MG/ML IV BOLUS
INTRAVENOUS | Status: DC | PRN
  Administered 2014-07-09: 100 mg via INTRAVENOUS

## 2014-07-09 MED ORDER — SODIUM CHLORIDE 0.9 % IV SOLN: INTRAVENOUS | Status: DC

## 2014-07-09 NOTE — Anesthesia Postprocedure Evaluation (Signed)
  Anesthesia Post-op Note  Patient: Richard Salazar  Procedure(s) Performed: Procedure(s): COLONOSCOPY (N/A)  Anesthesia type:General  Patient location: PACU  Post pain: Pain level controlled  Post assessment: Post-op Vital signs reviewed, Patient's Cardiovascular Status Stable, Respiratory Function Stable, Patent Airway and No signs of Nausea or vomiting  Post vital signs: Reviewed and stable  Last Vitals:  Filed Vitals:   07/09/14 1237  BP: 118/81  Pulse: 97  Temp:   Resp: 16    Level of consciousness: awake, alert  and patient cooperative  Complications: No apparent anesthesia complications

## 2014-07-09 NOTE — Anesthesia Preprocedure Evaluation (Signed)
Anesthesia Evaluation  Patient identified by MRN, date of birth, ID band Patient awake    Airway Mallampati: II  TM Distance: >3 FB     Dental no notable dental hx.    Pulmonary neg pulmonary ROS,    Pulmonary exam normal       Cardiovascular negative cardio ROS  Rhythm:Regular Rate:Normal     Neuro/Psych  Headaches, negative psych ROS   GI/Hepatic Neg liver ROS, GERD-  ,  Endo/Other  negative endocrine ROS  Renal/GU Renal disease  negative genitourinary   Musculoskeletal negative musculoskeletal ROS (+)   Abdominal   Peds  Hematology negative hematology ROS (+)   Anesthesia Other Findings   Reproductive/Obstetrics                             Anesthesia Physical Anesthesia Plan  ASA: II  Anesthesia Plan: General   Post-op Pain Management:    Induction: Intravenous  Airway Management Planned: Nasal Cannula  Additional Equipment:   Intra-op Plan:   Post-operative Plan:   Informed Consent: I have reviewed the patients History and Physical, chart, labs and discussed the procedure including the risks, benefits and alternatives for the proposed anesthesia with the patient or authorized representative who has indicated his/her understanding and acceptance.     Plan Discussed with:   Anesthesia Plan Comments:         Anesthesia Quick Evaluation

## 2014-07-09 NOTE — H&P (Signed)
Primary Care Physician:  Sheral Flow, NP Primary Gastroenterologist:  Dr. Vira Agar  Pre-Procedure History & Physical: HPI:  Richard Salazar is a 54 y.o. male is here for an colonoscopy.   Past Medical History  Diagnosis Date  . BPH (benign prostatic hyperplasia)   . Deafness     Left ear, loss  . GERD (gastroesophageal reflux disease)   . Shingles     15 years ago  . Kidney stones   . Migraines   . Chicken pox     Past Surgical History  Procedure Laterality Date  . Lumbar disc surgery      2004  . Spine surgery    . Back surgery      Prior to Admission medications   Medication Sig Start Date End Date Taking? Authorizing Provider  fluticasone (FLONASE) 50 MCG/ACT nasal spray Place 2 sprays into both nostrils daily. 04/07/14   Pleas Koch, NP  loratadine (CLARITIN) 10 MG tablet Take 10 mg by mouth daily.    Historical Provider, MD  Multiple Vitamin (MULTIVITAMIN) capsule Take 1 capsule by mouth daily.    Historical Provider, MD  polyethylene glycol powder (GLYCOLAX/MIRALAX) powder Take as directed for colonoscopy prep. 05/25/14   Historical Provider, MD    Allergies as of 05/25/2014  . (No Known Allergies)    Family History  Problem Relation Age of Onset  . Alzheimer's disease Mother   . Hypertension Father   . Heart disease Father     Stent placement  . Crohn's disease Brother     History   Social History  . Marital Status: Single    Spouse Name: N/A  . Number of Children: N/A  . Years of Education: N/A   Occupational History  . Not on file.   Social History Main Topics  . Smoking status: Never Smoker   . Smokeless tobacco: Never Used  . Alcohol Use: 0.6 oz/week    0 Standard drinks or equivalent, 1 Glasses of wine per week     Comment: occ  . Drug Use: No  . Sexual Activity: Not on file   Other Topics Concern  . Not on file   Social History Narrative   Married, 12 years.   One daughter age 53.    Works at [employer]   Enjoys  play piano, banjo, sports.    Review of Systems: See HPI, otherwise negative ROS  Physical Exam: Pulse 84  Temp(Src) 96.6 F (35.9 C) (Tympanic)  Resp 20  Ht 5\' 8"  (1.727 m)  Wt 84.369 kg (186 lb)  BMI 28.29 kg/m2 General:   Alert,  pleasant and cooperative in NAD Head:  Normocephalic and atraumatic. Neck:  Supple; no masses or thyromegaly. Lungs:  Clear throughout to auscultation.    Heart:  Regular rate and rhythm. Abdomen:  Soft, nontender and nondistended. Normal bowel sounds, without guarding, and without rebound.   Neurologic:  Alert and  oriented x4;  grossly normal neurologically.  Impression/Plan: [name] is here for an colonoscopy to be performed for screening  Risks, benefits, limitations, and alternatives regarding  colonoscopy have been reviewed with the patient.  Questions have been answered.  All parties agreeable.   Richard Cheers, MD  07/09/2014, 11:41 AM   Primary Care Physician:  Sheral Flow, NP Primary Gastroenterologist:  Dr. Vira Agar  Pre-Procedure History & Physical: HPI:  Richard Salazar is a 54 y.o. male is here for an colonoscopy.   Past Medical History  Diagnosis Date  .  BPH (benign prostatic hyperplasia)   . Deafness     Left ear, loss  . GERD (gastroesophageal reflux disease)   . Shingles     15 years ago  . Kidney stones   . Migraines   . Chicken pox     Past Surgical History  Procedure Laterality Date  . Lumbar disc surgery      2004  . Spine surgery    . Back surgery      Prior to Admission medications   Medication Sig Start Date End Date Taking? Authorizing Provider  fluticasone (FLONASE) 50 MCG/ACT nasal spray Place 2 sprays into both nostrils daily. 04/07/14   Pleas Koch, NP  loratadine (CLARITIN) 10 MG tablet Take 10 mg by mouth daily.    Historical Provider, MD  Multiple Vitamin (MULTIVITAMIN) capsule Take 1 capsule by mouth daily.    Historical Provider, MD  polyethylene glycol powder  (GLYCOLAX/MIRALAX) powder Take as directed for colonoscopy prep. 05/25/14   Historical Provider, MD    Allergies as of 05/25/2014  . (No Known Allergies)    Family History  Problem Relation Age of Onset  . Alzheimer's disease Mother   . Hypertension Father   . Heart disease Father     Stent placement  . Crohn's disease Brother     History   Social History  . Marital Status: Single    Spouse Name: N/A  . Number of Children: N/A  . Years of Education: N/A   Occupational History  . Not on file.   Social History Main Topics  . Smoking status: Never Smoker   . Smokeless tobacco: Never Used  . Alcohol Use: 0.6 oz/week    0 Standard drinks or equivalent, 1 Glasses of wine per week     Comment: occ  . Drug Use: No  . Sexual Activity: Not on file   Other Topics Concern  . Not on file   Social History Narrative   Married, 12 years.   One daughter age 67.    Works at [employer]   Enjoys play piano, banjo, sports.    Review of Systems: See HPI, otherwise negative ROS  Physical Exam: Pulse 84  Temp(Src) 96.6 F (35.9 C) (Tympanic)  Resp 20  Ht 5\' 8"  (1.727 m)  Wt 84.369 kg (186 lb)  BMI 28.29 kg/m2 General:   Alert,  pleasant and cooperative in NAD Head:  Normocephalic and atraumatic. Neck:  Supple; no masses or thyromegaly. Lungs:  Clear throughout to auscultation.    Heart:  Regular rate and rhythm. Abdomen:  Soft, nontender and nondistended. Normal bowel sounds, without guarding, and without rebound.   Neurologic:  Alert and  oriented x4;  grossly normal neurologically.  Impression/Plan: Richard Salazar is here for an colonoscopy to be performed for screening  Risks, benefits, limitations, and alternatives regarding  colonoscopy have been reviewed with the patient.  Questions have been answered.  All parties agreeable.   Richard Cheers, MD  07/09/2014, 11:41 AM

## 2014-07-09 NOTE — Transfer of Care (Signed)
Immediate Anesthesia Transfer of Care Note  Patient: Richard Salazar  Procedure(s) Performed: Procedure(s): COLONOSCOPY (N/A)  Patient Location: PACU  Anesthesia Type:General  Level of Consciousness: awake  Airway & Oxygen Therapy: Patient Spontanous Breathing  Post-op Assessment: Report given to RN  Post vital signs: stable  Last Vitals:  Filed Vitals:   07/09/14 1022  Pulse: 84  Temp: 35.9 C  Resp: 20    Complications: No apparent anesthesia complications

## 2014-07-09 NOTE — Op Note (Signed)
[facility] Gastroenterology Patient Name: Richard Salazar Procedure Date: 07/09/2014 11:38 AM MRN: [id number] Account #: [id number] Date of Birth: August 21, 1960 Admit Type: Outpatient Age: 54 Room: [facility] ENDO ROOM 1 Gender: Male Note Status: Finalized Procedure:         Colonoscopy Indications:       Screening for colorectal malignant neoplasm Providers:         Manya Silvas, MD Referring MD:      Pleas Koch (Referring MD) Medicines:         Propofol per Anesthesia Complications:     No immediate complications. Procedure:         Pre-Anesthesia Assessment:                    - After reviewing the risks and benefits, the patient was                     deemed in satisfactory condition to undergo the procedure.                    After obtaining informed consent, the colonoscope was                     passed under direct vision. Throughout the procedure, the                     patient's blood pressure, pulse, and oxygen saturations                     were monitored continuously. The Colonoscope was                     introduced through the anus and advanced to the the cecum,                     identified by appendiceal orifice and ileocecal valve. The                     colonoscopy was performed without difficulty. The patient                     tolerated the procedure well. The quality of the bowel                     preparation was excellent. Findings:      A small polyp was found at the hepatic flexure. The polyp was sessile.       The polyp was removed with a jumbo cold forceps. Resection and retrieval       were complete.      Internal hemorrhoids were found during endoscopy. The hemorrhoids were       small and Grade I (internal hemorrhoids that do not prolapse).      A few small-medium mouthed diverticula were found in the sigmoid colon.      The exam was otherwise without abnormality. Impression:        - One small polyp at the hepatic  flexure. Resected and                     retrieved.                    - Internal hemorrhoids.                    -  The examination was otherwise normal. Recommendation:    - Await pathology results. Manya Silvas, MD 07/09/2014 12:06:10 PM This report has been signed electronically. Number of Addenda: 0 Note Initiated On: 07/09/2014 11:38 AM Scope Withdrawal Time: 0 hours 12 minutes 36 seconds  Total Procedure Duration: 0 hours 17 minutes 27 seconds       [facility]

## 2014-07-12 LAB — SURGICAL PATHOLOGY

## 2014-07-19 ENCOUNTER — Encounter: Payer: Self-pay | Admitting: Unknown Physician Specialty

## 2015-10-28 ENCOUNTER — Telehealth: Payer: Self-pay | Admitting: Primary Care

## 2015-10-28 DIAGNOSIS — H9190 Unspecified hearing loss, unspecified ear: Secondary | ICD-10-CM

## 2015-10-28 NOTE — Telephone Encounter (Signed)
Noted. Referral placed. Please notify patient. We will see him soon.

## 2015-10-28 NOTE — Telephone Encounter (Signed)
Patient went to Dr.Rosen about his hearing.  Dr.Rosen recommended that patient have a stapadectomy.  Patient said Dr.Rosen said he could do the procedure.  Patient said Dr. Constance Holster only does 1 of these procedures a year and the doctor patient would like to be referred to does several a year.  Patient would like to be referred to [place].  Phone [phone]. Patient can be reached on his cell phone, but sometime it has bad reception.  Patient said if he can't be contacted on his cell phone you can call his wife, Arbie Cookey at  [phone]. Patient is going to schedule an appointment to discuss this and to discuss his weight and a possible hernia.

## 2015-10-31 NOTE — Telephone Encounter (Signed)
Message left for patient to return my call.  

## 2015-11-02 NOTE — Telephone Encounter (Signed)
Spoken to patient and he stated that he already spoken to someone regarding the referral.  Patient wanted to also give the days that he can do the appointment.  Patient said   The week of October 16 to 20, so any day but not October 17.  The week of October 23 to 24, so any day but October 24.  The week of October 30 to November 1-3, so any day but October 31.  Patient stated that he picks up his daughter from school so if can have appointment between 10 am - 1 pm.

## 2015-11-02 NOTE — Telephone Encounter (Signed)
Patient returned Chan's call.  Please call patient back at [phone].

## 2015-11-03 ENCOUNTER — Other Ambulatory Visit: Payer: Self-pay | Admitting: Family Medicine

## 2015-11-03 NOTE — Telephone Encounter (Signed)
Spoke to [facility] (referral line [phone]). Dr. Oletta Cohn is not taking new patients, he is opening his own practice in a different state.   [facility] suggested Dr. Jenny Reichmann May. Pt aware and will do some research on Dr. May and will let me know how he wants to proceed.

## 2015-11-04 ENCOUNTER — Other Ambulatory Visit: Payer: Self-pay | Admitting: Primary Care

## 2015-11-04 DIAGNOSIS — H9190 Unspecified hearing loss, unspecified ear: Secondary | ICD-10-CM

## 2015-11-04 LAB — CMP12+LP+TP+TSH+6AC+PSA+CBC…
ALK PHOS: 79 IU/L (ref 39–117)
ALT: 14 IU/L (ref 0–44)
AST: 15 IU/L (ref 0–40)
Albumin/Globulin Ratio: 1.7 (ref 1.2–2.2)
Albumin: 4.7 g/dL (ref 3.5–5.5)
BUN / CREAT RATIO: 13 (ref 9–20)
BUN: 12 mg/dL (ref 6–24)
Basophils Absolute: 0 10*3/uL (ref 0.0–0.2)
Basos: 0 %
Bilirubin Total: 0.4 mg/dL (ref 0.0–1.2)
CALCIUM: 9.6 mg/dL (ref 8.7–10.2)
CHLORIDE: 98 mmol/L (ref 96–106)
CHOL/HDL RATIO: 3.7 ratio (ref 0.0–5.0)
CREATININE: 0.94 mg/dL (ref 0.76–1.27)
Cholesterol, Total: 161 mg/dL (ref 100–199)
EOS (ABSOLUTE): 0.1 10*3/uL (ref 0.0–0.4)
EOS: 1 %
ESTIMATED CHD RISK: 0.7 times avg. (ref 0.0–1.0)
Free Thyroxine Index: 2 (ref 1.2–4.9)
GFR calc Af Amer: 105 mL/min/{1.73_m2} (ref >59)
GFR, EST NON AFRICAN AMERICAN: 91 mL/min/{1.73_m2} (ref >59)
GGT: 13 IU/L (ref 0–65)
Globulin, Total: 2.8 g/dL (ref 1.5–4.5)
Glucose: 92 mg/dL (ref 65–99)
HDL: 43 mg/dL (ref >39)
HEMOGLOBIN: 15.1 g/dL (ref 12.6–17.7)
Hematocrit: 42.6 % (ref 37.5–51.0)
IMMATURE GRANULOCYTES: 0 %
Immature Grans (Abs): 0 10*3/uL (ref 0.0–0.1)
Iron: 98 ug/dL (ref 38–169)
LDH: 135 IU/L (ref 121–224)
LDL CALC: 98 mg/dL (ref 0–99)
LYMPHS ABS: 2.5 10*3/uL (ref 0.7–3.1)
Lymphs: 46 %
MCH: 33.6 pg — ABNORMAL HIGH (ref 26.6–33.0)
MCHC: 35.4 g/dL (ref 31.5–35.7)
MCV: 95 fL (ref 79–97)
MONOS ABS: 0.6 10*3/uL (ref 0.1–0.9)
Monocytes: 10 %
NEUTROS ABS: 2.4 10*3/uL (ref 1.4–7.0)
NEUTROS PCT: 43 %
POTASSIUM: 4.1 mmol/L (ref 3.5–5.2)
Phosphorus: 2.9 mg/dL (ref 2.5–4.5)
Platelets: 221 10*3/uL (ref 150–379)
Prostate Specific Ag, Serum: 0.6 ng/mL (ref 0.0–4.0)
RBC: 4.49 x10E6/uL (ref 4.14–5.80)
RDW: 13 % (ref 12.3–15.4)
SODIUM: 138 mmol/L (ref 134–144)
T3 Uptake Ratio: 32 % (ref 24–39)
T4, Total: 6.2 ug/dL (ref 4.5–12.0)
TRIGLYCERIDES: 101 mg/dL (ref 0–149)
TSH: 2.87 u[IU]/mL (ref 0.450–4.500)
Total Protein: 7.5 g/dL (ref 6.0–8.5)
Uric Acid: 6.3 mg/dL (ref 3.7–8.6)
VLDL Cholesterol Cal: 20 mg/dL (ref 5–40)
WBC: 5.5 10*3/uL (ref 3.4–10.8)

## 2015-11-04 LAB — HGB A1C W/O EAG: Hgb A1c MFr Bld: 5.5 % (ref 4.8–5.6)

## 2015-11-08 ENCOUNTER — Ambulatory Visit (INDEPENDENT_AMBULATORY_CARE_PROVIDER_SITE_OTHER): Payer: No Typology Code available for payment source | Admitting: Primary Care

## 2015-11-08 ENCOUNTER — Encounter: Payer: Self-pay | Admitting: Primary Care

## 2015-11-08 DIAGNOSIS — E669 Obesity, unspecified: Secondary | ICD-10-CM | POA: Diagnosis not present

## 2015-11-08 DIAGNOSIS — E66811 Obesity, class 1: Secondary | ICD-10-CM

## 2015-11-08 NOTE — Progress Notes (Signed)
Subjective:    Patient ID: Richard Salazar, male    DOB: 1960-06-28, 55 y.o.   MRN: [id number]  HPI  Mr. Braden is a 55 year old male who presents today with multiple complaints.  1) Hernia: Establish care in April 2016 with persistent worry concerning abdominal fat. He was referred to a plastic surgeon per his request for further evaluation of liposuction. He presented to the plastic surgeon in May 2016 and was told by the plastic surgeon that he has a ventral hernia and therefore that he could not perform liposuction. He was told by the plastic surgeon to follow up with another provider in [place].  Since his visit with the plastic surgeon in May 2016 he has not followed up with the surgeon in [place] as recommended. He is uncertain if the surgeon in [place] is a Education officer, environmental or a Psychiatric nurse. He continues to be frustrated at his abdominal girth and continued weight gain despite healthy diet. He is also concerned that his hernia has become more prominent over the past 1 year. He denies pain, nausea, vomiting. He has noticed shortness of breath with increased activity as the hernia will cause pressure to chest wall. He recently had a "wellness assessment" at work but does not remember his lab results.  His diet currently consists of: Breakfast: Protein bars, coffee with cream and sugar Lunch: Sandwich, brown rice, chips, vegetables Dinner: Fish, sausage, vegetables Snacks: Nuts Beverages: Water, un-sweet tea, coffee Desserts: Occasional  Exercise: He does not currently exercise. Is active around his house.   Wt Readings from Last 3 Encounters:  11/08/15 198 lb (89.8 kg)  07/09/14 186 lb (84.4 kg)  06/30/14 191 lb (86.6 kg)     Review of Systems  Constitutional: Negative for fever.  Respiratory: Negative for shortness of breath.   Cardiovascular: Negative for chest pain.  Gastrointestinal: Negative for abdominal pain, diarrhea and vomiting.  Endocrine: Negative for  polyuria.       Past Medical History:  Diagnosis Date  . BPH (benign prostatic hyperplasia)   . Chicken pox   . Deafness    Left ear, loss  . GERD (gastroesophageal reflux disease)   . Kidney stones   . Migraines   . Shingles    15 years ago     Social History   Social History  . Marital status: Single    Spouse name: N/A  . Number of children: N/A  . Years of education: N/A   Occupational History  . Not on file.   Social History Main Topics  . Smoking status: Never Smoker  . Smokeless tobacco: Never Used  . Alcohol use 0.6 oz/week    1 Glasses of wine per week     Comment: occ  . Drug use: No  . Sexual activity: Not on file   Other Topics Concern  . Not on file   Social History Narrative   Married, 12 years.   One daughter age 26.    Works at [employer]   Enjoys play piano, banjo, sports.    Past Surgical History:  Procedure Laterality Date  . BACK SURGERY    . COLONOSCOPY N/A 07/09/2014   Procedure: COLONOSCOPY;  Surgeon: Manya Silvas, MD;  Location: [facility] ENDOSCOPY;  Service: Endoscopy;  Laterality: N/A;  . LUMBAR [place] SURGERY     2004  . SPINE SURGERY      Family History  Problem Relation Age of Onset  . Alzheimer's disease Mother   .  Hypertension Father   . Heart disease Father     Stent placement  . Crohn's disease Brother     No Known Allergies  Current Outpatient Prescriptions on File Prior to Visit  Medication Sig Dispense Refill  . Multiple Vitamin (MULTIVITAMIN) capsule Take 1 capsule by mouth daily.     No current facility-administered medications on file prior to visit.     BP 106/74   Pulse 81   Temp 98.2 F (36.8 C) (Oral)   Ht 5\' 8"  (1.727 m)   Wt 198 lb (89.8 kg)   SpO2 97%   BMI 30.11 kg/m    Objective:   Physical Exam  Constitutional: He appears well-nourished.  Neck: Neck supple.  Cardiovascular: Normal rate and regular rhythm.   Pulmonary/Chest: Effort normal and breath sounds normal.  Abdominal: Soft.  Bowel sounds are normal. There is no tenderness.  Obvious ventral hernia  Skin: Skin is warm and dry.          Assessment & Plan:

## 2015-11-08 NOTE — Progress Notes (Signed)
Pre visit review using our clinic review tool, if applicable. No additional management support is needed unless otherwise documented below in the visit note. 

## 2015-11-08 NOTE — Assessment & Plan Note (Signed)
Weight gain of 7 pounds since last visit. Continues to be frustrated regarding abdominal fat. Long discussion today regarding goals in regards to his abdomen. Highly recommend he start exercising. Recommended to incorporate more fresh fruits and vegetables into diet, less processed food. He will find out if the surgeon and Richard Salazar is a Teacher, early years/pre. If the doctor and Richard Salazar is a Psychiatric nurse, patient will inquire their services.  Would like to check A1c and TSH to ensure no metabolic cause to weight gain. He will bring recent labs obtained from wellness screening.

## 2015-11-08 NOTE — Patient Instructions (Signed)
Please call the Plastic Surgeon's office and find out if they were referring you to a general surgeon or another plastic surgeon.  Please bring your recent lab results so I may review.  Start exercising. You should be getting 150 minutes of moderate intensity exercise weekly.  Ensure you are consuming 64 ounces of water daily.  Keep me updated, I'm here to help!  It was a pleasure to see you today!

## 2015-11-17 ENCOUNTER — Encounter: Payer: Self-pay | Admitting: Primary Care

## 2015-12-29 DIAGNOSIS — H9012 Conductive hearing loss, unilateral, left ear, with unrestricted hearing on the contralateral side: Secondary | ICD-10-CM | POA: Insufficient documentation

## 2015-12-29 DIAGNOSIS — H8092 Unspecified otosclerosis, left ear: Secondary | ICD-10-CM | POA: Insufficient documentation

## 2015-12-29 DIAGNOSIS — H900 Conductive hearing loss, bilateral: Secondary | ICD-10-CM | POA: Insufficient documentation

## 2016-03-29 ENCOUNTER — Encounter: Payer: Self-pay | Admitting: Primary Care

## 2016-04-18 ENCOUNTER — Encounter: Payer: Self-pay | Admitting: Primary Care

## 2016-04-18 DIAGNOSIS — K439 Ventral hernia without obstruction or gangrene: Secondary | ICD-10-CM

## 2016-05-04 ENCOUNTER — Other Ambulatory Visit: Payer: Self-pay | Admitting: Surgery

## 2016-05-04 DIAGNOSIS — R1011 Right upper quadrant pain: Secondary | ICD-10-CM

## 2016-05-17 ENCOUNTER — Other Ambulatory Visit: Payer: No Typology Code available for payment source

## 2016-05-24 ENCOUNTER — Ambulatory Visit
Admission: RE | Admit: 2016-05-24 | Discharge: 2016-05-24 | Disposition: A | Discharge status: Home / Self Care | Payer: No Typology Code available for payment source | Source: Ambulatory Visit | Attending: Surgery | Admitting: Surgery

## 2016-05-24 DIAGNOSIS — R1011 Right upper quadrant pain: Secondary | ICD-10-CM

## 2016-05-24 NOTE — Progress Notes (Signed)
Please call the patient and let them know that their ultrasound showed that their gallbladder did not contain stones.  Please order a HIDA scan with GB ejection fraction to evaluate gallbladder function.  Thanks  Chanya Chrisley

## 2016-05-25 ENCOUNTER — Other Ambulatory Visit (HOSPITAL_COMMUNITY): Payer: Self-pay | Admitting: Surgery

## 2016-05-25 DIAGNOSIS — R1011 Right upper quadrant pain: Secondary | ICD-10-CM

## 2016-06-13 ENCOUNTER — Encounter (HOSPITAL_COMMUNITY)
Admission: RE | Admit: 2016-06-13 | Discharge: 2016-06-13 | Disposition: A | Discharge status: Home / Self Care | Payer: No Typology Code available for payment source | Source: Ambulatory Visit | Attending: Surgery | Admitting: Surgery

## 2016-06-13 DIAGNOSIS — R1011 Right upper quadrant pain: Secondary | ICD-10-CM | POA: Diagnosis present

## 2016-06-13 MED ORDER — TECHNETIUM TC 99M MEBROFENIN IV KIT
5.0000 | PACK | Freq: Once | INTRAVENOUS | Status: AC | PRN
  Administered 2016-06-13: 5 via INTRAVENOUS

## 2016-10-19 ENCOUNTER — Ambulatory Visit: Payer: Self-pay | Admitting: *Deleted

## 2016-10-19 VITALS — BP 119/85 | HR 63 | Ht 68.5 in | Wt 194.0 lb

## 2016-10-19 DIAGNOSIS — Z0000 Encounter for general adult medical examination without abnormal findings: Secondary | ICD-10-CM

## 2016-10-19 NOTE — Progress Notes (Signed)
[employer] insurance premium discount evaluation: Labs Drawn. Replacements ROI form signed. Tobacco Free Attestation form signed.  Forms placed in paper chart. Okay to route results to pcp per pt. 

## 2016-10-20 LAB — CMP12+LP+TP+TSH+6AC+PSA+CBC…
A/G RATIO: 1.5 (ref 1.2–2.2)
ALT: 13 IU/L (ref 0–44)
AST: 20 IU/L (ref 0–40)
Albumin: 4.6 g/dL (ref 3.5–5.5)
Alkaline Phosphatase: 76 IU/L (ref 39–117)
BUN / CREAT RATIO: 14 (ref 9–20)
BUN: 12 mg/dL (ref 6–24)
Basophils Absolute: 0 10*3/uL (ref 0.0–0.2)
Basos: 0 %
Bilirubin Total: 0.3 mg/dL (ref 0.0–1.2)
CREATININE: 0.88 mg/dL (ref 0.76–1.27)
Calcium: 9.4 mg/dL (ref 8.7–10.2)
Chloride: 102 mmol/L (ref 96–106)
Chol/HDL Ratio: 3.1 ratio (ref 0.0–5.0)
Cholesterol, Total: 150 mg/dL (ref 100–199)
EOS (ABSOLUTE): 0.1 10*3/uL (ref 0.0–0.4)
Eos: 1 %
Estimated CHD Risk: <0.5 times avg. (ref 0.0–1.0)
Free Thyroxine Index: 2 (ref 1.2–4.9)
GFR calc Af Amer: 111 mL/min/{1.73_m2} (ref >59)
GFR calc non Af Amer: 96 mL/min/{1.73_m2} (ref >59)
GGT: 13 IU/L (ref 0–65)
GLUCOSE: 87 mg/dL (ref 65–99)
Globulin, Total: 3.1 g/dL (ref 1.5–4.5)
HDL: 48 mg/dL (ref >39)
Hematocrit: 43.5 % (ref 37.5–51.0)
Hemoglobin: 14.7 g/dL (ref 13.0–17.7)
IRON: 62 ug/dL (ref 38–169)
Immature Grans (Abs): 0 10*3/uL (ref 0.0–0.1)
Immature Granulocytes: 0 %
LDH: 149 IU/L (ref 121–224)
LDL Calculated: 90 mg/dL (ref 0–99)
Lymphocytes Absolute: 2.1 10*3/uL (ref 0.7–3.1)
Lymphs: 38 %
MCH: 32.7 pg (ref 26.6–33.0)
MCHC: 33.8 g/dL (ref 31.5–35.7)
MCV: 97 fL (ref 79–97)
Monocytes Absolute: 0.4 10*3/uL (ref 0.1–0.9)
Monocytes: 7 %
Neutrophils Absolute: 2.9 10*3/uL (ref 1.4–7.0)
Neutrophils: 54 %
Phosphorus: 2.5 mg/dL (ref 2.5–4.5)
Platelets: 228 10*3/uL (ref 150–379)
Potassium: 4.4 mmol/L (ref 3.5–5.2)
Prostate Specific Ag, Serum: 0.6 ng/mL (ref 0.0–4.0)
RBC: 4.49 x10E6/uL (ref 4.14–5.80)
RDW: 13.4 % (ref 12.3–15.4)
Sodium: 141 mmol/L (ref 134–144)
T3 UPTAKE RATIO: 32 % (ref 24–39)
T4, Total: 6.3 ug/dL (ref 4.5–12.0)
TOTAL PROTEIN: 7.7 g/dL (ref 6.0–8.5)
TSH: 1.9 u[IU]/mL (ref 0.450–4.500)
Triglycerides: 61 mg/dL (ref 0–149)
Uric Acid: 5.5 mg/dL (ref 3.7–8.6)
VLDL Cholesterol Cal: 12 mg/dL (ref 5–40)
WBC: 5.4 10*3/uL (ref 3.4–10.8)

## 2016-10-20 LAB — HGB A1C W/O EAG: Hgb A1c MFr Bld: 5.4 % (ref 4.8–5.6)

## 2016-10-22 NOTE — Progress Notes (Signed)
Results reviewed with pt via phone. All labs WNL. BP WNL. BMI 29. Diet and exercise recommendations for health maintenance discussed. Routine f/u with pcp. Copy of labs will be available in MyChart after NP reviews and releases. Results routed to pcp per pt request. No further questions concerns.

## 2017-03-26 ENCOUNTER — Ambulatory Visit: Payer: Self-pay | Admitting: Registered Nurse

## 2017-03-26 VITALS — BP 134/89 | HR 71 | Temp 98.6°F

## 2017-03-26 DIAGNOSIS — H9313 Tinnitus, bilateral: Secondary | ICD-10-CM | POA: Insufficient documentation

## 2017-03-26 DIAGNOSIS — H6982 Other specified disorders of Eustachian tube, left ear: Secondary | ICD-10-CM

## 2017-03-26 DIAGNOSIS — J019 Acute sinusitis, unspecified: Secondary | ICD-10-CM

## 2017-03-26 MED ORDER — SALINE SPRAY 0.65 % NA SOLN: 2.0000 | NASAL | 0 refills | Status: AC

## 2017-03-26 MED ORDER — FLUTICASONE PROPIONATE 50 MCG/ACT NA SUSP: 1.0000 | Freq: Two times a day (BID) | NASAL | 6 refills | Status: DC

## 2017-03-26 MED ORDER — LORATADINE 10 MG PO TABS: 10.0000 mg | ORAL_TABLET | Freq: Every day | ORAL | 11 refills | Status: AC

## 2017-03-26 NOTE — Patient Instructions (Addendum)
Viral Illness, Adult Viruses are tiny germs that can get into a person's body and cause illness. There are many different types of viruses, and they cause many types of illness. Viral illnesses can range from mild to severe. They can affect various parts of the body. Common illnesses that are caused by a virus include colds and the flu. Viral illnesses also include serious conditions such as HIV/AIDS (human immunodeficiency virus/acquired immunodeficiency syndrome). A few viruses have been linked to certain cancers. What are the causes? Many types of viruses can cause illness. Viruses invade cells in your body, multiply, and cause the infected cells to malfunction or die. When the cell dies, it releases more of the virus. When this happens, you develop symptoms of the illness, and the virus continues to spread to other cells. If the virus takes over the function of the cell, it can cause the cell to divide and grow out of control, as is the case when a virus causes cancer. Different viruses get into the body in different ways. You can get a virus by:  Swallowing food or water that is contaminated with the virus.  Breathing in droplets that have been coughed or sneezed into the air by an infected person.  Touching a surface that has been contaminated with the virus and then touching your eyes, nose, or mouth.  Being bitten by an insect or animal that carries the virus.  Having sexual contact with a person who is infected with the virus.  Being exposed to blood or fluids that contain the virus, either through an open cut or during a transfusion.  If a virus enters your body, your body's defense system (immune system) will try to fight the virus. You may be at higher risk for a viral illness if your immune system is weak. What are the signs or symptoms? Symptoms vary depending on the type of virus and the location of the cells that it invades. Common symptoms of the main types of viral illnesses  include: Cold and flu viruses  Fever.  Headache.  Sore throat.  Muscle aches.  Nasal congestion.  Cough. Digestive system (gastrointestinal) viruses  Fever.  Abdominal pain.  Nausea.  Diarrhea. Liver viruses (hepatitis)  Loss of appetite.  Tiredness.  Yellowing of the skin (jaundice). Brain and spinal cord viruses  Fever.  Headache.  Stiff neck.  Nausea and vomiting.  Confusion or sleepiness. Skin viruses  Warts.  Itching.  Rash. Sexually transmitted viruses  Discharge.  Swelling.  Redness.  Rash. How is this treated? Viruses can be difficult to treat because they live within cells. Antibiotic medicines do not treat viruses because these drugs do not get inside cells. Treatment for a viral illness may include:  Resting and drinking plenty of fluids.  Medicines to relieve symptoms. These can include over-the-counter medicine for pain and fever, medicines for cough or congestion, and medicines to relieve diarrhea.  Antiviral medicines. These drugs are available only for certain types of viruses. They may help reduce flu symptoms if taken early. There are also many antiviral medicines for hepatitis and HIV/AIDS.  Some viral illnesses can be prevented with vaccinations. A common example is the flu shot. Follow these instructions at home: Medicines   Take over-the-counter and prescription medicines only as told by your health care provider.  If you were prescribed an antiviral medicine, take it as told by your health care provider. Do not stop taking the medicine even if you start to feel better.  Be aware   of when antibiotics are needed and when they are not needed. Antibiotics do not treat viruses. If your health care provider thinks that you may have a bacterial infection as well as a viral infection, you may get an antibiotic. ? Do not ask for an antibiotic prescription if you have been diagnosed with a viral illness. That will not make your  illness go away faster. ? Frequently taking antibiotics when they are not needed can lead to antibiotic resistance. When this develops, the medicine no longer works against the bacteria that it normally fights. General instructions  Drink enough fluids to keep your urine clear or pale yellow.  Rest as much as possible.  Return to your normal activities as told by your health care provider. Ask your health care provider what activities are safe for you.  Keep all follow-up visits as told by your health care provider. This is important. How is this prevented? Take these actions to reduce your risk of viral infection:  Eat a healthy diet and get enough rest.  Wash your hands often with soap and water. This is especially important when you are in public places. If soap and water are not available, use hand sanitizer.  Avoid close contact with friends and family who have a viral illness.  If you travel to areas where viral gastrointestinal infection is common, avoid drinking water or eating raw food.  Keep your immunizations up to date. Get a flu shot every year as told by your health care provider.  Do not share toothbrushes, nail clippers, razors, or needles with other people.  Always practice safe sex.  Contact a health care provider if:  You have symptoms of a viral illness that do not go away.  Your symptoms come back after going away.  Your symptoms get worse. Get help right away if:  You have trouble breathing.  You have a severe headache or a stiff neck.  You have severe vomiting or abdominal pain. This information is not intended to replace advice given to you by your health care provider. Make sure you discuss any questions you have with your health care provider. Document Released: 05/20/2015 Document Revised: 06/22/2015 Document Reviewed: 05/20/2015 Elsevier Interactive Patient Education  2018 [employer]. Eustachian Tube Dysfunction The eustachian tube connects  the middle ear to the back of the nose. It regulates air pressure in the middle ear by allowing air to move between the ear and nose. It also helps to drain fluid from the middle ear space. When the eustachian tube does not function properly, air pressure, fluid, or both can build up in the middle ear. Eustachian tube dysfunction can affect one or both ears. What are the causes? This condition happens when the eustachian tube becomes blocked or cannot open normally. This may result from:  Ear infections.  Colds and other upper respiratory infections.  Allergies.  Irritation, such as from cigarette smoke or acid from the stomach coming up into the esophagus (gastroesophageal reflux).  Sudden changes in air pressure, such as from descending in an airplane.  Abnormal growths in the nose or throat, such as nasal polyps, tumors, or enlarged tissue at the back of the throat (adenoids).  What increases the risk? This condition may be more likely to develop in people who smoke and people who are overweight. Eustachian tube dysfunction may also be more likely to develop in children, especially children who have:  Certain birth defects of the mouth, such as cleft palate.  Large tonsils and  adenoids.  What are the signs or symptoms? Symptoms of this condition may include:  A feeling of fullness in the ear.  Ear pain.  Clicking or popping noises in the ear.  Ringing in the ear.  Hearing loss.  Loss of balance.  Symptoms may get worse when the air pressure around you changes, such as when you travel to an area of high elevation or fly on an airplane. How is this diagnosed? This condition may be diagnosed based on:  Your symptoms.  A physical exam of your ear, nose, and throat.  Tests, such as those that measure: ? The movement of your eardrum (tympanogram). ? Your hearing (audiometry).  How is this treated? Treatment depends on the cause and severity of your condition. If your  symptoms are mild, you may be able to relieve your symptoms by moving air into ("popping") your ears. If you have symptoms of fluid in your ears, treatment may include:  Decongestants.  Antihistamines.  Nasal sprays or ear drops that contain medicines that reduce swelling (steroids).  In some cases, you may need to have a procedure to drain the fluid in your eardrum (myringotomy). In this procedure, a small tube is placed in the eardrum to:  Drain the fluid.  Restore the air in the middle ear space.  Follow these instructions at home:  Take over-the-counter and prescription medicines only as told by your health care provider.  Use techniques to help pop your ears as recommended by your health care provider. These may include: ? Chewing gum. ? Yawning. ? Frequent, forceful swallowing. ? Closing your mouth, holding your nose closed, and gently blowing as if you are trying to blow air out of your nose.  Do not do any of the following until your health care provider approves: ? Travel to high altitudes. ? Fly in airplanes. ? Work in a [employer] or room. ? Scuba dive.  Keep your ears dry. Dry your ears completely after showering or bathing.  Do not smoke.  Keep all follow-up visits as told by your health care provider. This is important. Contact a health care provider if:  Your symptoms do not go away after treatment.  Your symptoms come back after treatment.  You are unable to pop your ears.  You have: ? A fever. ? Pain in your ear. ? Pain in your head or neck. ? Fluid draining from your ear.  Your hearing suddenly changes.  You become very dizzy.  You lose your balance. This information is not intended to replace advice given to you by your health care provider. Make sure you discuss any questions you have with your health care provider. Document Released: 02/04/2015 Document Revised: 06/16/2015 Document Reviewed: 01/27/2014 Elsevier Interactive Patient  Education  2018 [employer]. Sinusitis, Adult Sinusitis is soreness and inflammation of your sinuses. Sinuses are hollow spaces in the bones around your face. Your sinuses are located:  Around your eyes.  In the middle of your forehead.  Behind your nose.  In your cheekbones.  Your sinuses and nasal passages are lined with a stringy fluid (mucus). Mucus normally drains out of your sinuses. When your nasal tissues become inflamed or swollen, the mucus can become trapped or blocked so air cannot flow through your sinuses. This allows bacteria, viruses, and funguses to grow, which leads to infection. Sinusitis can develop quickly and last for 7?10 days (acute) or for more than 12 weeks (chronic). Sinusitis often develops after a cold. What are the causes?  This condition is caused by anything that creates swelling in the sinuses or stops mucus from draining, including:  Allergies.  Asthma.  Bacterial or viral infection.  Abnormally shaped bones between the nasal passages.  Nasal growths that contain mucus (nasal polyps).  Narrow sinus openings.  Pollutants, such as chemicals or irritants in the air.  A foreign object stuck in the nose.  A fungal infection. This is rare.  What increases the risk? The following factors may make you more likely to develop this condition:  Having allergies or asthma.  Having had a recent cold or respiratory tract infection.  Having structural deformities or blockages in your nose or sinuses.  Having a weak immune system.  Doing a lot of swimming or diving.  Overusing nasal sprays.  Smoking.  What are the signs or symptoms? The main symptoms of this condition are pain and a feeling of pressure around the affected sinuses. Other symptoms include:  Upper toothache.  Earache.  Headache.  Bad breath.  Decreased sense of smell and taste.  A cough that may get worse at night.  Fatigue.  Fever.  Thick drainage from your nose.  The drainage is often green and it may contain pus (purulent).  Stuffy nose or congestion.  Postnasal drip. This is when extra mucus collects in the throat or back of the nose.  Swelling and warmth over the affected sinuses.  Sore throat.  Sensitivity to light.  How is this diagnosed? This condition is diagnosed based on symptoms, a medical history, and a physical exam. To find out if your condition is acute or chronic, your health care provider may:  Look in your nose for signs of nasal polyps.  Tap over the affected sinus to check for signs of infection.  View the inside of your sinuses using an imaging device that has a light attached (endoscope).  If your health care provider suspects that you have chronic sinusitis, you may also:  Be tested for allergies.  Have a sample of mucus taken from your nose (nasal culture) and checked for bacteria.  Have a mucus sample examined to see if your sinusitis is related to an allergy.  If your sinusitis does not respond to treatment and it lasts longer than 8 weeks, you may have an MRI or CT scan to check your sinuses. These scans also help to determine how severe your infection is. In rare cases, a bone biopsy may be done to rule out more serious types of fungal sinus disease. How is this treated? Treatment for sinusitis depends on the cause and whether your condition is chronic or acute. If a virus is causing your sinusitis, your symptoms will go away on their own within 10 days. You may be given medicines to relieve your symptoms, including:  Topical nasal decongestants. They shrink swollen nasal passages and let mucus drain from your sinuses.  Antihistamines. These drugs block inflammation that is triggered by allergies. This can help to ease swelling in your nose and sinuses.  Topical nasal corticosteroids. These are nasal sprays that ease inflammation and swelling in your nose and sinuses.  Nasal saline washes. These rinses can  help to get rid of thick mucus in your nose.  If your condition is caused by bacteria, you will be given an antibiotic medicine. If your condition is caused by a fungus, you will be given an antifungal medicine. Surgery may be needed to correct underlying conditions, such as narrow nasal passages. Surgery may also be needed to  remove polyps. Follow these instructions at home: Medicines  Take, use, or apply over-the-counter and prescription medicines only as told by your health care provider. These may include nasal sprays.  If you were prescribed an antibiotic medicine, take it as told by your health care provider. Do not stop taking the antibiotic even if you start to feel better. Hydrate and Humidify  Drink enough water to keep your urine clear or pale yellow. Staying hydrated will help to thin your mucus.  Use a cool mist humidifier to keep the humidity level in your home above 50%.  Inhale steam for 10-15 minutes, 3-4 times a day or as told by your health care provider. You can do this in the bathroom while a hot shower is running.  Limit your exposure to cool or dry air. Rest  Rest as much as possible.  Sleep with your head raised (elevated).  Make sure to get enough sleep each night. General instructions  Apply a warm, moist washcloth to your face 3-4 times a day or as told by your health care provider. This will help with discomfort.  Wash your hands often with soap and water to reduce your exposure to viruses and other germs. If soap and water are not available, use hand sanitizer.  Do not smoke. Avoid being around people who are smoking (secondhand smoke).  Keep all follow-up visits as told by your health care provider. This is important. Contact a health care provider if:  You have a fever.  Your symptoms get worse.  Your symptoms do not improve within 10 days. Get help right away if:  You have a severe headache.  You have persistent vomiting.  You have pain  or swelling around your face or eyes.  You have vision problems.  You develop confusion.  Your neck is stiff.  You have trouble breathing. This information is not intended to replace advice given to you by your health care provider. Make sure you discuss any questions you have with your health care provider. Document Released: 01/08/2005 Document Revised: 09/04/2015 Document Reviewed: 11/03/2014 Elsevier Interactive Patient Education  2018 [employer]. Sinus Rinse What is a sinus rinse? A sinus rinse is a simple home treatment that is used to rinse your sinuses with a sterile mixture of salt and water (saline solution). Sinuses are air-filled spaces in your skull behind the bones of your face and forehead that open into your nasal cavity. You will use the following:  Saline solution.  Neti pot or spray bottle. This releases the saline solution into your nose and through your sinuses. Neti pots and spray bottles can be purchased at [employer], a health food store, or online.  When would I do a sinus rinse? A sinus rinse can help to clear mucus, dirt, dust, or pollen from the nasal cavity. You may do a sinus rinse when you have a cold, a virus, nasal allergy symptoms, a sinus infection, or stuffiness in the nose or sinuses. If you are considering a sinus rinse:  Ask your child's health care provider before performing a sinus rinse on your child.  Do not do a sinus rinse if you have had ear or nasal surgery, ear infection, or blocked ears.  How do I do a sinus rinse?  Wash your hands.  Disinfect your device according to the directions provided and then dry it.  Use the solution that comes with your device or one that is sold separately in stores. Follow the mixing directions on  the package.  Fill your device with the amount of saline solution as directed by the device instructions.  Stand over a sink and tilt your head sideways over the sink.  Place the spout of the  device in your upper nostril (the one closer to the ceiling).  Gently pour or squeeze the saline solution into the nasal cavity. The liquid should drain to the lower nostril if you are not overly congested.  Gently blow your nose. Blowing too hard may cause ear pain.  Repeat in the other nostril.  Clean and rinse your device with clean water and then air-dry it. Are there risks of a sinus rinse? Sinus rinse is generally very safe and effective. However, there are a few risks, which include:  A burning sensation in the sinuses. This may happen if you do not make the saline solution as directed. Make sure to follow all directions when making the saline solution.  Infection from contaminated water. This is rare, but possible.  Nasal irritation.  This information is not intended to replace advice given to you by your health care provider. Make sure you discuss any questions you have with your health care provider. Document Released: 08/05/2013 Document Revised: 12/06/2015 Document Reviewed: 05/26/2013 Elsevier Interactive Patient Education  2017 [employer].

## 2017-03-26 NOTE — Progress Notes (Signed)
Subjective:    Patient ID: Richard Salazar, male    DOB: 1960-09-19, 57 y.o.   MRN: [id number]  57y/o married caucasian male established Pt c/o HA over L eye x3 days. +nausea and light sensitivity yesterday. Pain with turning neck to left below his ear.  And worsening pain if he rubs above his eyebrows.  Hx migraines and HAs. Rarely last over one day. Usually relief with ibuprofen/acetaminophen/excedrin or caffeine. No relief this time. Pain waxing/waning between 2-10/10 over past 3 days.   Has had viral illness over the past week finally feeling better.  Was using nyquil for cough stopped as improving.  + sick contacts at work.  PMHx seasonal allergies has not started his spring regimen  Denied teeth/ear pain/congestion.      Review of Systems  Constitutional: Positive for fatigue. Negative for activity change, appetite change, chills, diaphoresis, fever and unexpected weight change.  HENT: Positive for postnasal drip and rhinorrhea. Negative for congestion, dental problem, drooling, ear discharge, ear pain, facial swelling, hearing loss, mouth sores, nosebleeds, sinus pressure, sinus pain, sneezing, sore throat, tinnitus, trouble swallowing and voice change.   Eyes: Positive for photophobia. Negative for pain, discharge, redness, itching and visual disturbance.  Respiratory: Positive for cough. Negative for choking, chest tightness, shortness of breath, wheezing and stridor.   Cardiovascular: Negative for chest pain, palpitations and leg swelling.  Gastrointestinal: Positive for nausea. Negative for abdominal distention, abdominal pain, blood in stool, constipation, diarrhea and vomiting.  Endocrine: Negative for cold intolerance and heat intolerance.  Genitourinary: Negative for dysuria.  Musculoskeletal: Positive for neck pain. Negative for arthralgias, back pain, gait problem, joint swelling, myalgias and neck stiffness.  Skin: Negative for color change, pallor, rash and wound.   Allergic/Immunologic: Positive for environmental allergies. Negative for food allergies and immunocompromised state.  Neurological: Positive for headaches. Negative for dizziness, tremors, seizures, syncope, facial asymmetry, speech difficulty, weakness, light-headedness and numbness.  Hematological: Negative for adenopathy. Does not bruise/bleed easily.  Psychiatric/Behavioral: Negative for agitation, confusion and sleep disturbance.       Objective:   Physical Exam  Constitutional: He is oriented to person, place, and time. Vital signs are normal. He appears well-developed and well-nourished. He is active and cooperative.  Non-toxic appearance. He does not have a sickly appearance. He appears ill. No distress.  HENT:  Head: Normocephalic and atraumatic. Head is without raccoon's eyes, without Battle's sign, without abrasion, without contusion, without right periorbital erythema and without left periorbital erythema. Hair is normal.    Right Ear: Hearing, external ear and ear canal normal. A middle ear effusion is present.  Left Ear: Hearing, external ear and ear canal normal. Tympanic membrane is scarred. A middle ear effusion is present.  Nose: Mucosal edema and rhinorrhea present. No nose lacerations, sinus tenderness, nasal deformity, septal deviation or nasal septal hematoma. No epistaxis.  No foreign bodies. Right sinus exhibits no maxillary sinus tenderness and no frontal sinus tenderness. Left sinus exhibits no maxillary sinus tenderness and no frontal sinus tenderness.  Mouth/Throat: Uvula is midline and mucous membranes are normal. Mucous membranes are not pale, not dry and not cyanotic. He does not have dentures. No oral lesions. No trismus in the jaw. Normal dentition. No dental abscesses, uvula swelling, lacerations or dental caries. Posterior oropharyngeal edema and posterior oropharyngeal erythema present. No oropharyngeal exudate or tonsillar abscesses.  Clear discharge bilateral  nares scant turbinates edema/erythema; cobblestoning posterior pharynx; TTP over left eustachian tube worsening with left rotation of neck; bilateral allergic  shiners; bilateral TMs air fluid level clear and left TM scarred  Eyes: Conjunctivae, EOM and lids are normal. Pupils are equal, round, and reactive to light. Right eye exhibits no chemosis, no discharge, no exudate and no hordeolum. No foreign body present in the right eye. Left eye exhibits no chemosis, no discharge, no exudate and no hordeolum. No foreign body present in the left eye. Right conjunctiva is not injected. Right conjunctiva has no hemorrhage. Left conjunctiva is not injected. Left conjunctiva has no hemorrhage. No scleral icterus. Right eye exhibits normal extraocular motion and no nystagmus. Left eye exhibits normal extraocular motion and no nystagmus. Right pupil is round and reactive. Left pupil is round and reactive. Pupils are equal.  Neck: Trachea normal, normal range of motion and phonation normal. Neck supple. No tracheal tenderness and no muscular tenderness present. No neck rigidity. No tracheal deviation, no edema, no erythema and normal range of motion present. No thyroid mass and no thyromegaly present.    Cardiovascular: Normal rate, regular rhythm, S1 normal, S2 normal, normal heart sounds and intact distal pulses. PMI is not displaced. Exam reveals no gallop and no friction rub.  No murmur heard. Pulmonary/Chest: Effort normal and breath sounds normal. No accessory muscle usage or stridor. No respiratory distress. He has no decreased breath sounds. He has no wheezes. He has no rhonchi. He has no rales.  Rare nonproductive cough in exam room; spoke full sentences without difficulty  Abdominal: Soft. Normal appearance. He exhibits no distension, no fluid wave and no ascites. There is no rigidity and no guarding.  Sipping water in exam room   Musculoskeletal: Normal range of motion. He exhibits no edema, tenderness or  deformity.       Right shoulder: Normal.       Left shoulder: Normal.       Right elbow: Normal.      Left elbow: Normal.       Right hip: Normal.       Left hip: Normal.       Right knee: Normal.       Left knee: Normal.       Cervical back: Normal.       Thoracic back: Normal.       Lumbar back: Normal.       Right hand: Normal.       Left hand: Normal.  Lymphadenopathy:       Head (right side): No submental, no submandibular, no tonsillar, no preauricular, no posterior auricular and no occipital adenopathy present.       Head (left side): No submental, no submandibular, no tonsillar, no preauricular, no posterior auricular and no occipital adenopathy present.    He has no cervical adenopathy.       Right cervical: No superficial cervical, no deep cervical and no posterior cervical adenopathy present.      Left cervical: No superficial cervical, no deep cervical and no posterior cervical adenopathy present.  Neurological: He is alert and oriented to person, place, and time. He has normal strength. He is not disoriented. He displays no atrophy and no tremor. No cranial nerve deficit or sensory deficit. He exhibits normal muscle tone. He displays no seizure activity. Coordination and gait normal. GCS eye subscore is 4. GCS verbal subscore is 5. GCS motor subscore is 6.  On/off exam table/in/out of chair without difficulty; gait sure and steady in hallway; bilateral hand grasp equal 5/5  Skin: Skin is warm, dry and intact. No abrasion, no bruising,  no burn, no ecchymosis, no laceration, no lesion, no petechiae and no rash noted. He is not diaphoretic. No cyanosis or erythema. No pallor. Nails show no clubbing.  Psychiatric: He has a normal mood and affect. His speech is normal and behavior is normal. Judgment and thought content normal. Cognition and memory are normal.  Nursing note and vitals reviewed.         Assessment & Plan:  A-acute rhinosinusitis, left eustachian tube  dysfunction, viral illness  P-discussed with patient frontal sinuses/trigeminal foramen near areas that increase headache sensitivity with rubbing. Also weather fronts have been alternating in area from warm/mild to below freezing can be worsening headache/migraine symptoms/sinusitis also.  Did not want oral steroids at this time. Medications to decrease inflammation started by viral illness.  Hydrate to keep urine pale yellow clear as patient reports it has been more concentrated.  Tolerating po okay does not want antiemetic at this time.  He was given tylenol 325mg  UD from clinic stock 12 tabs discussed max dosing 975mg  po QID may alternate with motrin 800 mg po TID or naproxen 500mg  po BID.  Restart zyrtec 10mg  po daily.  Restart flonase 1 spray each nostril BID #1 RF6 Electronic Rx to his pharmacy of choice, saline 2 sprays each nostril q2h wa prn congestion given 1 bottle from clinic stock.  If no improvement with 48 hours of saline and flonase Will Rx augmentin 875mg  po BID x 10 days #20 RF0 patient to return 03/28/17 for pickup.  Denied personal or family history of ENT cancer.  Shower BID especially prior to bed. No evidence of systemic bacterial infection, non toxic and well hydrated.  I do not see where any further testing or imaging is necessary at this time.   I will suggest supportive care, rest, good hygiene and encourage the patient to take adequate fluids.  The patient is to return to clinic or EMERGENCY ROOM if symptoms worsen or change significantly.  Exitcare handout on viral illness, sinusitis and sinus rinse given to patient.  Patient verbalized agreement and understanding of treatment plan and had no further questions at this time.   P2:  Hand washing and cover cough  Supportive treatment.   No evidence of invasive bacterial infection, non toxic and well hydrated.  This is most likely self limiting viral infection.  I do not see where any further testing or imaging is necessary at this  time.   I will suggest supportive care, rest, good hygiene and encourage the patient to take adequate fluids.  The patient is to return to clinic or EMERGENCY ROOM if symptoms worsen or change significantly e.g. ear pain, fever, purulent discharge from ears or bleeding.  Exitcare handout on eustachian tube dysfunction given to patient.  Patient verbalized agreement and understanding of treatment plan.

## 2017-03-28 ENCOUNTER — Encounter: Payer: Self-pay | Admitting: Registered Nurse

## 2017-03-28 ENCOUNTER — Telehealth: Payer: Self-pay | Admitting: Registered Nurse

## 2017-03-28 MED ORDER — AMOXICILLIN-POT CLAVULANATE 875-125 MG PO TABS: 1.0000 | ORAL_TABLET | Freq: Two times a day (BID) | ORAL | 0 refills | Status: DC

## 2017-03-28 NOTE — Telephone Encounter (Signed)
Contacted patient via telephone for follow up.  Still having left sided neck pain behind ear.  Instructed to walk into clinic for follow up/re-evaluation today. A&Ox3 BBS CTA B ambulatory gait sure and steady skin warm dry and pink spoke full sentences without difficulty right TM air fluid level clear sinuses not TTP bilaterally  Left TM air fluid level clear but TM with erythema and bulging with scarring.  Eustachian tube and ant/posterior cervical lymph nodes not TTP/not enlarged but pain traveling along eustachian tube with left rotation of neck and holding at end rotation chin over shoulder.  Cobblestoning posterior pharynx.  Temple and frontal headache resolved with flonase/nasal saline use.  Discussed with patient to continue plan of care as previously discussed and add augmentin 875mg  mg po BID  10 days #20 RF0 dispensed from [facility].  Follow up if ear discharge/bleeding, dysphagia, dysphasia, or neck swelling/redness for re-evaluation.  Patient verbalized understanding information/instructions, agreed with plan of care and had no further questions at this time.  Dx acute left otitis media nonsupportive recurrent

## 2018-02-27 ENCOUNTER — Encounter: Payer: Self-pay | Admitting: Registered Nurse

## 2018-02-27 ENCOUNTER — Ambulatory Visit: Payer: Self-pay | Admitting: Registered Nurse

## 2018-02-27 VITALS — BP 125/69 | HR 67 | Temp 98.3°F

## 2018-02-27 DIAGNOSIS — M6208 Separation of muscle (nontraumatic), other site: Secondary | ICD-10-CM

## 2018-02-27 DIAGNOSIS — R12 Heartburn: Secondary | ICD-10-CM

## 2018-02-27 MED ORDER — OMEPRAZOLE 20 MG PO CPDR: 20.0000 mg | DELAYED_RELEASE_CAPSULE | Freq: Every day | ORAL | 0 refills | Status: DC

## 2018-02-27 NOTE — Patient Instructions (Addendum)
Ventral Hernia  A ventral hernia is a bulge of tissue from inside the abdomen that pushes through a weak area of the muscles that form the front wall of the abdomen. The tissues inside the abdomen are inside a sac (peritoneum). These tissues include the small intestine, large intestine, and the fatty tissue that covers the intestines (omentum). Sometimes, the bulge that forms a hernia contains intestines. Other hernias contain only fat. Ventral hernias do not go away without surgical treatment. There are several types of ventral hernias. You may have:  A hernia at an incision site from previous abdominal surgery (incisional hernia).  A hernia just above the belly button (epigastric hernia), or at the belly button (umbilical hernia). These types of hernias can develop from heavy lifting or straining.  A hernia that comes and goes (reducible hernia). It may be visible only when you lift or strain. This type of hernia can be pushed back into the abdomen (reduced).  A hernia that traps abdominal tissue inside the hernia (incarcerated hernia). This type of hernia does not reduce.  A hernia that cuts off blood flow to the tissues inside the hernia (strangulated hernia). The tissues can start to die if this happens. This is a very painful bulge that cannot be reduced. A strangulated hernia is a medical emergency. What are the causes? This condition is caused by abdominal tissue putting pressure on an area of weakness in the abdominal muscles. What increases the risk? The following factors may make you more likely to develop this condition:  Being male.  Being 60 or older.  Being overweight or obese.  Having had previous abdominal surgery, especially if there was an infection after surgery.  Having had an injury to the abdominal wall.  Having had several pregnancies.  Having a buildup of fluid inside the abdomen (ascites). What are the signs or symptoms? The only symptom of a ventral hernia  may be a painless bulge in the abdomen. A reducible hernia may be visible only when you strain, cough, or lift. Other symptoms may include:  Dull pain.  A feeling of pressure. Signs and symptoms of a strangulated hernia may include:  Increasing pain.  Nausea and vomiting.  Pain when pressing on the hernia.  The skin over the hernia turning red or purple.  Constipation.  Blood in the stool (feces). How is this diagnosed? This condition may be diagnosed based on:  Your symptoms.  Your medical history.  A physical exam. You may be asked to cough or strain while standing. These actions increase the pressure inside your abdomen and force the hernia through the opening in your muscles. Your health care provider may try to reduce the hernia by pressing on it.  Imaging studies, such as an ultrasound or CT scan. How is this treated? This condition is treated with surgery. If you have a strangulated hernia, surgery is done as soon as possible. If your hernia is small and not incarcerated, you may be asked to lose some weight before surgery. Follow these instructions at home:  Follow instructions from your health care provider about eating or drinking restrictions.  If you are overweight, your health care provider may recommend that you increase your activity level and eat a healthier diet.  Do not lift anything that is heavier than 10 lb (4.5 kg).  Return to your normal activities as told by your health care provider. Ask your health care provider what activities are safe for you. You may need to avoid activities   that increase pressure on your hernia.  Take over-the-counter and prescription medicines only as told by your health care provider.  Keep all follow-up visits as told by your health care provider. This is important. Contact a health care provider if:  Your hernia gets larger.  Your hernia becomes painful. Get help right away if:  Your hernia becomes increasingly  painful.  You have pain along with any of the following: ? Changes in skin color in the area of the hernia. ? Nausea. ? Vomiting. ? Fever. Summary  A ventral hernia is a bulge of tissue from inside the abdomen that pushes through a weak area of the muscles that form the front wall of the abdomen.  This condition is treated with surgery, which may be urgent depending on your hernia.  Do not lift anything that is heavier than 10 lb (4.5 kg), and follow activity instructions from your health care provider. This information is not intended to replace advice given to you by your health care provider. Make sure you discuss any questions you have with your health care provider. Document Released: 12/26/2011 Document Revised: 02/20/2017 Document Reviewed: 07/30/2016 Elsevier Interactive Patient Education  2019 [place]  Diastasis recti is when the muscles of the abdomen (rectus abdominis muscles) become thin and separate. The result is a wider space between the right and left abdomen (abdominal) muscles. This wider space between the muscles may cause a bulge in the middle of your abdomen. You may notice this bulge when you are straining or when you sit up from a lying down position. Diastasis recti can affect men and women. It is most common among pregnant women, infants, people who are obese, and people who have had abdominal surgery. Exercise or surgical treatment may help correct it. What are the causes? Common causes of this condition include:  Pregnancy. The growing uterus puts pressure on the abdominal muscles, which causes the muscles to separate.  Obesity. Excess fat puts pressure on abdominal muscles.  Weightlifting.  Some abdomen exercises.  Advanced age.  Genetics.  Prior abdominal surgery. What increases the risk? This condition is more likely to develop in:  Women.  Newborns, especially newborns who are born early (prematurely). What are the signs  or symptoms? Common symptoms of this condition include:  A bulge in the middle of the abdomen. You will notice it most when you sit up or strain.  Pain in the low back, pelvis, or hips.  Constipation.  Inability to control when you urinate (urinary incontinence).  Bloating.  Poor posture. How is this diagnosed? This condition is diagnosed with a physical exam. Your health care provider will ask you to lie flat on your back and do a crunch or half sit-up. If you have diastasis recti, a vertical bulge will appear between your abdominal muscles in the center of your abdomen. Your health care provider will measure the gap between your muscles with one of the following:  A medical device used to measure the space between two objects (caliper).  A tape measure.  CT scan.  Ultrasound.  Finger spaces. Your health care provider will measure the space using their fingers. How is this treated? If your muscle separation is not too large, you may not need treatment. However, if you are a woman who plans to become pregnant again, you should treat this condition before your next pregnancy. Treatment may include:  Physical therapy to strengthen and tighten your abdominal muscles.  Lifestyle changes such as weight loss  and exercise.  Over-the-counter pain medicines as needed.  Surgery to correct the separation. Follow these instructions at home: Activity  Return to your normal activities as told by your health care provider. Ask your health care provider what activities are safe for you.  When lifting weights or doing exercises using your abdominal muscles or the muscles in the center of your body that give stability (core muscles), make sure you are doing your exercises and movements correctly. Proper form can help to prevent the condition from happening again. General instructions  If you are overweight, ask your health care provider for help with weight loss. Losing even a small amount  of weight can help to improve your diastasis recti.  Take over-the-counter or prescription medicines only as told by your health care provider.  Do not strain. Straining can make the separation worse. Examples of straining include: ? Pushing hard to have a bowel movement, such as due to constipation. ? Lifting heavy objects, including children. ? Standing up and sitting down.  Take steps to prevent constipation: ? Drink enough fluid to keep your urine clear or pale yellow. ? Take over-the-counter or prescription medicines only as directed. ? Eat foods that are high in fiber, such as fresh fruits and vegetables, whole grains, and beans. ? Limit foods that are high in fat and processed sugars, such as fried and sweet foods. Contact a health care provider if:  You notice a new bulge in your abdomen. Get help right away if:  You experience severe discomfort in your abdomen.  You develop severe abdominal pain along with nausea, vomiting, or fever. Summary  Diastasis recti is when the abdomen (abdominal) muscles become thin and separate. Your abdomen will stick out because the space between your right and left abdomen muscles has widened.  The most common symptom is a bulge in your abdomen. You will notice it most when you sit up or are straining.  This condition is diagnosed during a physical exam.  If the abdomen separation is not too big, you may choose not to have treatment. Otherwise, you may need to undergo physical therapy or surgery. This information is not intended to replace advice given to you by your health care provider. Make sure you discuss any questions you have with your health care provider. Document Released: 03/05/2016 Document Revised: 03/05/2016 Document Reviewed: 03/05/2016 Elsevier Interactive Patient Education  2019 [employer] Hiatal Hernia  A hiatal hernia occurs when part of the stomach slides above the muscle that separates the abdomen from the chest  (diaphragm). A person can be born with a hiatal hernia (congenital), or it may develop over time. In almost all cases of hiatal hernia, only the top part of the stomach pushes through the diaphragm. Many people have a hiatal hernia with no symptoms. The larger the hernia, the more likely it is that you will have symptoms. In some cases, a hiatal hernia allows stomach acid to flow back into the tube that carries food from your mouth to your stomach (esophagus). This may cause heartburn symptoms. Severe heartburn symptoms may mean that you have developed a condition called gastroesophageal reflux disease (GERD). What are the causes? This condition is caused by a weakness in the opening (hiatus) where the esophagus passes through the diaphragm to attach to the upper part of the stomach. A person may be born with a weakness in the hiatus, or a weakness can develop over time. What increases the risk? This condition is more likely to develop  in:  Older people. Age is a major risk factor for a hiatal hernia, especially if you are over the age of 6.  Pregnant women.  People who are overweight.  People who have frequent constipation. What are the signs or symptoms? Symptoms of this condition usually develop in the form of GERD symptoms. Symptoms include:  Heartburn.  Belching.  Indigestion.  Trouble swallowing.  Coughing or wheezing.  Sore throat.  Hoarseness.  Chest pain.  Nausea and vomiting. How is this diagnosed? This condition may be diagnosed during testing for GERD. Tests that may be done include:  X-rays of your stomach or chest.  An upper gastrointestinal (GI) series. This is an X-ray exam of your GI tract that is taken after you swallow a chalky liquid that shows up clearly on the X-ray.  Endoscopy. This is a procedure to look into your stomach using a thin, flexible tube that has a tiny camera and light on the end of it. How is this treated? This condition may be treated  by:  Dietary and lifestyle changes to help reduce GERD symptoms.  Medicines. These may include: ? Over-the-counter antacids. ? Medicines that make your stomach empty more quickly. ? Medicines that block the production of stomach acid (H2 blockers). ? Stronger medicines to reduce stomach acid (proton pump inhibitors).  Surgery to repair the hernia, if other treatments are not helping. If you have no symptoms, you may not need treatment. Follow these instructions at home: Lifestyle and activity  Do not use any products that contain nicotine or tobacco, such as cigarettes and e-cigarettes. If you need help quitting, ask your health care provider.  Try to achieve and maintain a healthy body weight.  Avoid putting pressure on your abdomen. Anything that puts pressure on your abdomen increases the amount of acid that may be pushed up into your esophagus. ? Avoid bending over, especially after eating. ? Raise the head of your bed by putting blocks under the legs. This keeps your head and esophagus higher than your stomach. ? Do not wear tight clothing around your chest or stomach. ? Try not to strain when having a bowel movement, when urinating, or when lifting heavy objects. Eating and drinking  Avoid foods that can worsen GERD symptoms. These may include: ? Fatty foods, like fried foods. ? Citrus fruits, like oranges or lemon. ? Other foods and drinks that contain acid, like orange juice or tomatoes. ? Spicy food. ? Chocolate.  Eat frequent small meals instead of three large meals a day. This helps prevent your stomach from getting too full. ? Eat slowly. ? Do not lie down right after eating. ? Do not eat 1-2 hours before bed.  Do not drink beverages with caffeine. These include cola, coffee, cocoa, and tea.  Do not drink alcohol. General instructions  Take over-the-counter and prescription medicines only as told by your health care provider.  Keep all follow-up visits as told  by your health care provider. This is important. Contact a health care provider if:  Your symptoms are not controlled with medicines or lifestyle changes.  You are having trouble swallowing.  You have coughing or wheezing that will not go away. Get help right away if:  Your pain is getting worse.  Your pain spreads to your arms, neck, jaw, teeth, or back.  You have shortness of breath.  You sweat for no reason.  You feel sick to your stomach (nauseous) or you vomit.  You vomit blood.  You have  bright red blood in your stools.  You have black, tarry stools. This information is not intended to replace advice given to you by your health care provider. Make sure you discuss any questions you have with your health care provider. Document Released: 03/31/2003 Document Revised: 08/13/2016 Document Reviewed: 08/13/2016 Elsevier Interactive Patient Education  2019 [place]. Gastroesophageal Reflux Disease, Adult Gastroesophageal reflux (GER) happens when acid from the stomach flows up into the tube that connects the mouth and the stomach (esophagus). Normally, food travels down the esophagus and stays in the stomach to be digested. However, when a person has GER, food and stomach acid sometimes move back up into the esophagus. If this becomes a more serious problem, the person may be diagnosed with a disease called gastroesophageal reflux disease (GERD). GERD occurs when the reflux:  Happens often.  Causes frequent or severe symptoms.  Causes problems such as damage to the esophagus. When stomach acid comes in contact with the esophagus, the acid may cause soreness (inflammation) in the esophagus. Over time, GERD may create small holes (ulcers) in the lining of the esophagus. What are the causes? This condition is caused by a problem with the muscle between the esophagus and the stomach (lower esophageal sphincter, or LES). Normally, the LES muscle closes after food passes through  the esophagus to the stomach. When the LES is weakened or abnormal, it does not close properly, and that allows food and stomach acid to go back up into the esophagus. The LES can be weakened by certain dietary substances, medicines, and medical conditions, including:  Tobacco use.  Pregnancy.  Having a hiatal hernia.  Alcohol use.  Certain foods and beverages, such as coffee, chocolate, onions, and peppermint. What increases the risk? You are more likely to develop this condition if you:  Have an increased body weight.  Have a connective tissue disorder.  Use NSAID medicines. What are the signs or symptoms? Symptoms of this condition include:  Heartburn.  Difficult or painful swallowing.  The feeling of having a lump in the throat.  Abitter taste in the mouth.  Bad breath.  Having a large amount of saliva.  Having an upset or bloated stomach.  Belching.  Chest pain. Different conditions can cause chest pain. Make sure you see your health care provider if you experience chest pain.  Shortness of breath or wheezing.  Ongoing (chronic) cough or a night-time cough.  Wearing away of tooth enamel.  Weight loss. How is this diagnosed? Your health care provider will take a medical history and perform a physical exam. To determine if you have mild or severe GERD, your health care provider may also monitor how you respond to treatment. You may also have tests, including:  A test to examine your stomach and esophagus with a small camera (endoscopy).  A test thatmeasures the acidity level in your esophagus.  A test thatmeasures how much pressure is on your esophagus.  A barium swallow or modified barium swallow test to show the shape, size, and functioning of your esophagus. How is this treated? The goal of treatment is to help relieve your symptoms and to prevent complications. Treatment for this condition may vary depending on how severe your symptoms are. Your  health care provider may recommend:  Changes to your diet.  Medicine.  Surgery. Follow these instructions at home: Eating and drinking   Follow a diet as recommended by your health care provider. This may involve avoiding foods and drinks such as: ? Coffee  and tea (with or without caffeine). ? Drinks that containalcohol. ? Energy drinks and sports drinks. ? Carbonated drinks or sodas. ? Chocolate and cocoa. ? Peppermint and mint flavorings. ? Garlic and onions. ? Horseradish. ? Spicy and acidic foods, including peppers, chili powder, curry powder, vinegar, hot sauces, and barbecue sauce. ? Citrus fruit juices and citrus fruits, such as oranges, lemons, and limes. ? Tomato-based foods, such as red sauce, chili, salsa, and pizza with red sauce. ? Fried and fatty foods, such as donuts, french fries, potato chips, and high-fat dressings. ? High-fat meats, such as hot dogs and fatty cuts of red and white meats, such as rib eye steak, sausage, ham, and bacon. ? High-fat dairy items, such as whole milk, butter, and cream cheese.  Eat small, frequent meals instead of large meals.  Avoid drinking large amounts of liquid with your meals.  Avoid eating meals during the 2-3 hours before bedtime.  Avoid lying down right after you eat.  Do not exercise right after you eat. Lifestyle   Do not use any products that contain nicotine or tobacco, such as cigarettes, e-cigarettes, and chewing tobacco. If you need help quitting, ask your health care provider.  Try to reduce your stress by using methods such as yoga or meditation. If you need help reducing stress, ask your health care provider.  If you are overweight, reduce your weight to an amount that is healthy for you. Ask your health care provider for guidance about a safe weight loss goal. General instructions  Pay attention to any changes in your symptoms.  Take over-the-counter and prescription medicines only as told by your  health care provider. Do not take aspirin, ibuprofen, or other NSAIDs unless your health care provider told you to do so.  Wear loose-fitting clothing. Do not wear anything tight around your waist that causes pressure on your abdomen.  Raise (elevate) the head of your bed about 6 inches (15 cm).  Avoid bending over if this makes your symptoms worse.  Keep all follow-up visits as told by your health care provider. This is important. Contact a health care provider if:  You have: ? New symptoms. ? Unexplained weight loss. ? Difficulty swallowing or it hurts to swallow. ? Wheezing or a persistent cough. ? A hoarse voice.  Your symptoms do not improve with treatment. Get help right away if you:  Have pain in your arms, neck, jaw, teeth, or back.  Feel sweaty, dizzy, or light-headed.  Have chest pain or shortness of breath.  Vomit and your vomit looks like blood or coffee grounds.  Faint.  Have stool that is bloody or black.  Cannot swallow, drink, or eat. Summary  Gastroesophageal reflux happens when acid from the stomach flows up into the esophagus. GERD is a disease in which the reflux happens often, causes frequent or severe symptoms, or causes problems such as damage to the esophagus.  Treatment for this condition may vary depending on how severe your symptoms are. Your health care provider may recommend diet and lifestyle changes, medicine, or surgery.  Contact a health care provider if you have new or worsening symptoms.  Take over-the-counter and prescription medicines only as told by your health care provider. Do not take aspirin, ibuprofen, or other NSAIDs unless your health care provider told you to do so.  Keep all follow-up visits as told by your health care provider. This is important. This information is not intended to replace advice given to you by your health  care provider. Make sure you discuss any questions you have with your health care provider. Document  Released: 10/18/2004 Document Revised: 07/17/2017 Document Reviewed: 07/17/2017 Elsevier Interactive Patient Education  2019 [employer]  Acute Back Pain, Adult Acute back pain is sudden and usually short-lived. It is often caused by an injury to the muscles and tissues in the back. The injury may result from:  A muscle or ligament getting overstretched or torn (strained). Ligaments are tissues that connect bones to each other. Lifting something improperly can cause a back strain.  Wear and tear (degeneration) of the spinal disks. Spinal disks are circular tissue that provides cushioning between the bones of the spine (vertebrae).  Twisting motions, such as while playing sports or doing yard work.  A hit to the back.  Arthritis. You may have a physical exam, lab tests, and imaging tests to find the cause of your pain. Acute back pain usually goes away with rest and home care. Follow these instructions at home: Managing pain, stiffness, and swelling  Take over-the-counter and prescription medicines only as told by your health care provider.  Your health care provider may recommend applying ice during the first 24-48 hours after your pain starts. To do this: ? Put ice in a plastic bag. ? Place a towel between your skin and the bag. ? Leave the ice on for 20 minutes, 2-3 times a day.  If directed, apply heat to the affected area as often as told by your health care provider. Use the heat source that your health care provider recommends, such as a moist heat pack or a heating pad. ? Place a towel between your skin and the heat source. ? Leave the heat on for 20-30 minutes. ? Remove the heat if your skin turns bright red. This is especially important if you are unable to feel pain, heat, or cold. You have a greater risk of getting burned. Activity   Do not stay in bed. Staying in bed for more than 1-2 days can delay your recovery.  Sit up and stand up straight. Avoid leaning forward  when you sit, or hunching over when you stand. ? If you work at a desk, sit close to it so you do not need to lean over. Keep your chin tucked in. Keep your neck drawn back, and keep your elbows bent at a right angle. Your arms should look like the letter "L." ? Sit high and close to the steering wheel when you drive. Add lower back (lumbar) support to your car seat, if needed.  Take short walks on even surfaces as soon as you are able. Try to increase the length of time you walk each day.  Do not sit, drive, or stand in one place for more than 30 minutes at a time. Sitting or standing for long periods of time can put stress on your back.  Do not drive or use heavy machinery while taking prescription pain medicine.  Use proper lifting techniques. When you bend and lift, use positions that put less stress on your back: ? [place] your knees. ? Keep the load close to your body. ? Avoid twisting.  Exercise regularly as told by your health care provider. Exercising helps your back heal faster and helps prevent back injuries by keeping muscles strong and flexible.  Work with a physical therapist to make a safe exercise program, as recommended by your health care provider. Do any exercises as told by your physical therapist. Lifestyle  Maintain a  healthy weight. Extra weight puts stress on your back and makes it difficult to have good posture.  Avoid activities or situations that make you feel anxious or stressed. Stress and anxiety increase muscle tension and can make back pain worse. Learn ways to manage anxiety and stress, such as through exercise. General instructions  Sleep on a firm mattress in a comfortable position. Try lying on your side with your knees slightly bent. If you lie on your back, put a pillow under your knees.  Follow your treatment plan as told by your health care provider. This may include: ? Cognitive or behavioral therapy. ? Acupuncture or massage therapy. ? Meditation  or yoga. Contact a health care provider if:  You have pain that is not relieved with rest or medicine.  You have increasing pain going down into your legs or buttocks.  Your pain does not improve after 2 weeks.  You have pain at night.  You lose weight without trying.  You have a fever or chills. Get help right away if:  You develop new bowel or bladder control problems.  You have unusual weakness or numbness in your arms or legs.  You develop nausea or vomiting.  You develop abdominal pain.  You feel faint. Summary  Acute back pain is sudden and usually short-lived.  Use proper lifting techniques. When you bend and lift, use positions that put less stress on your back.  Take over-the-counter and prescription medicines and apply heat or ice as directed by your health care provider. This information is not intended to replace advice given to you by your health care provider. Make sure you discuss any questions you have with your health care provider. Document Released: 01/08/2005 Document Revised: 08/15/2017 Document Reviewed: 08/22/2016 Elsevier Interactive Patient Education  2019 [employer].

## 2018-02-27 NOTE — Progress Notes (Signed)
Subjective:    Patient ID: Richard Salazar, male    DOB: 22-Apr-1960, 58 y.o.   MRN: [id number]  57y/o caucasian married male established patient here for evaluation of gurgling mid chest especially pronounced when lying down at night.  Some heartburn symptoms he thought related to stopping nexium due to cost 6 months ago.  Was evaluated by plastic surgery and general surgery last year for bulge in abdomen and was told not a surgical candidate.  Patient reported upper abdomen has been getting bigger despite weight loss; abdomen not tender.  Denied nausea/vomiting/diarrhea.  Has had post nasal drip the past month also.  + sick contacts at work.  Low back midline pain started today also denied loss of bowel/bladder control or arm/leg weakness     Review of Systems  Constitutional: Negative for activity change, appetite change, chills, diaphoresis, fatigue and fever.  HENT: Positive for postnasal drip. Negative for trouble swallowing and voice change.   Eyes: Negative for photophobia and visual disturbance.  Respiratory: Negative for cough, shortness of breath, wheezing and stridor.   Cardiovascular: Negative for palpitations and leg swelling.  Gastrointestinal: Positive for abdominal distention. Negative for abdominal pain, blood in stool, constipation, diarrhea, nausea and vomiting.  Endocrine: Negative for cold intolerance and heat intolerance.  Genitourinary: Negative for difficulty urinating.  Musculoskeletal: Positive for back pain. Negative for gait problem, myalgias, neck pain and neck stiffness.  Skin: Negative for color change, pallor, rash and wound.  Allergic/Immunologic: Positive for environmental allergies. Negative for food allergies.  Neurological: Negative for dizziness, tremors, seizures, syncope, facial asymmetry, speech difficulty, weakness, light-headedness, numbness and headaches.  Hematological: Negative for adenopathy. Does not bruise/bleed easily.   Psychiatric/Behavioral: Negative for agitation, confusion and sleep disturbance.       Objective:   Physical Exam Vitals signs reviewed.  Constitutional:      General: He is awake. He is not in acute distress.    Appearance: Normal appearance. He is well-developed and well-groomed. He is not ill-appearing, toxic-appearing or diaphoretic.  HENT:     Head: Normocephalic and atraumatic.     Jaw: There is normal jaw occlusion. No trismus.     Salivary Glands: Right salivary gland is not diffusely enlarged or tender. Left salivary gland is not diffusely enlarged or tender.     Right Ear: Hearing and external ear normal.     Left Ear: Hearing and external ear normal.     Nose: Mucosal edema and rhinorrhea present. No nasal deformity, septal deviation, laceration or congestion.     Right Turbinates: Enlarged and swollen. Not pale.     Left Turbinates: Enlarged and swollen. Not pale.     Right Sinus: No maxillary sinus tenderness or frontal sinus tenderness.     Left Sinus: No maxillary sinus tenderness or frontal sinus tenderness.     Mouth/Throat:     Lips: Pink. No lesions.     Mouth: Mucous membranes are moist. Mucous membranes are not pale, not dry and not cyanotic. No injury, lacerations, oral lesions or angioedema.     Dentition: Normal dentition. Does not have dentures. No dental caries or dental abscesses.     Tongue: No lesions.     Pharynx: Uvula midline. Pharyngeal swelling and posterior oropharyngeal erythema present. No oropharyngeal exudate or uvula swelling.     Tonsils: No tonsillar exudate or tonsillar abscesses. Swelling: 0 on the right. 0 on the left.     Comments: Bilateral allergic shiners;cobblestoning posterior pharynx; bilateral nasal turbinates edema/erythema  clear discharge Eyes:     General: Lids are normal. Allergic shiner present. No visual field deficit or scleral icterus.       Right eye: No foreign body, discharge or hordeolum.        Left eye: No foreign  body, discharge or hordeolum.     Extraocular Movements: Extraocular movements intact.     Right eye: Normal extraocular motion and no nystagmus.     Left eye: Normal extraocular motion and no nystagmus.     Conjunctiva/sclera: Conjunctivae normal.     Right eye: Right conjunctiva is not injected. No chemosis, exudate or hemorrhage.    Left eye: Left conjunctiva is not injected. No chemosis, exudate or hemorrhage.    Pupils: Pupils are equal, round, and reactive to light. Pupils are equal.     Right eye: Pupil is round and reactive.     Left eye: Pupil is round and reactive.  Neck:     Musculoskeletal: Normal range of motion and neck supple. Normal range of motion. No edema, erythema, neck rigidity or muscular tenderness.     Thyroid: No thyroid mass or thyromegaly.     Trachea: Trachea and phonation normal. No tracheal tenderness or tracheal deviation.  Cardiovascular:     Rate and Rhythm: Normal rate and regular rhythm.     Chest Wall: PMI is not displaced.     Heart sounds: Normal heart sounds, S1 normal and S2 normal. No murmur. No friction rub. No gallop.   Pulmonary:     Effort: Pulmonary effort is normal. No respiratory distress.     Breath sounds: Normal breath sounds and air entry. No stridor, decreased air movement or transmitted upper airway sounds. No decreased breath sounds, wheezing, rhonchi or rales.     Comments: No cough observed in exam room; spoke full sentences without difficulty Abdominal:     General: Bowel sounds are decreased. There is no distension or abdominal bruit.     Palpations: Abdomen is soft. There is no shifting dullness, fluid wave, hepatomegaly, splenomegaly or mass.     Tenderness: There is no abdominal tenderness. There is no right CVA tenderness, left CVA tenderness, guarding or rebound. Negative signs include Murphy's sign and McBurney's sign.     Hernia: There is no hernia in the umbilical area.       Comments: Dull to percussion x 4 quads;  hypoactive bowel sounds x 4 quads; with abdominal crunch rectus diastasis proximal from xyphoid process to 5cm distal resolves with supine at rest position  Musculoskeletal: Normal range of motion.        General: No tenderness.     Right shoulder: Normal.     Left shoulder: Normal.     Right elbow: Normal.    Left elbow: Normal.     Right hip: Normal.     Left hip: Normal.     Right knee: Normal.     Left knee: Normal.     Cervical back: Normal.     Thoracic back: Normal.     Lumbar back: He exhibits pain. He exhibits normal range of motion, no bony tenderness, no swelling, no edema, no deformity, no laceration, no spasm and normal pulse.       Back:     Right hand: Normal.     Left hand: Normal.     Right lower leg: No edema.     Left lower leg: No edema.  Lymphadenopathy:     Head:     Right  side of head: No submental, submandibular, tonsillar, preauricular, posterior auricular or occipital adenopathy.     Left side of head: No submental, submandibular, tonsillar, preauricular, posterior auricular or occipital adenopathy.     Cervical: No cervical adenopathy.     Right cervical: No superficial, deep or posterior cervical adenopathy.    Left cervical: No superficial, deep or posterior cervical adenopathy.  Skin:    General: Skin is warm and dry.     Capillary Refill: Capillary refill takes less than 2 seconds.     Coloration: Skin is not ashen, cyanotic, jaundiced, mottled, pale or sallow.     Findings: No abrasion, abscess, acne, bruising, burn, ecchymosis, erythema, signs of injury, laceration, lesion, petechiae, rash or wound.     Nails: There is no clubbing.   Neurological:     General: No focal deficit present.     Mental Status: He is alert and oriented to person, place, and time. Mental status is at baseline.     GCS: GCS eye subscore is 4. GCS verbal subscore is 5. GCS motor subscore is 6.     Cranial Nerves: Cranial nerves are intact. No cranial nerve deficit,  dysarthria or facial asymmetry.     Sensory: Sensation is intact. No sensory deficit.     Motor: Motor function is intact. No weakness, tremor, atrophy, abnormal muscle tone or seizure activity.     Coordination: Coordination is intact. Coordination normal.     Gait: Gait is intact. Gait normal.     Comments: In/out of chair and on/off exam table without difficulty; standing/sitting to supine and reverse quickly; gait sure and steady in hallway  Psychiatric:        Attention and Perception: Attention and perception normal.        Mood and Affect: Mood and affect normal.        Speech: Speech normal.        Behavior: Behavior is uncooperative.        Thought Content: Thought content normal.        Cognition and Memory: Cognition and memory normal.        Judgment: Judgment normal.           Assessment & Plan:  A-heartburn, rectus diastasis, low back pain  P-Denied food getting stuck when swallowing or dysphagia.  Has noticed intermittent GERD sx.  Thought about restarting heartburn medication.  start omeprazole Dr 20mg  po daily with food #90 RF0 dispensed to patient from [employer].   take for one week consistently and let me know if symptoms improving.  Previously on nexium per patient but had stopped due to cost.  Avoid foods that worsen symptoms, wearing tight clothing at waist.  Consider elevated head of bed on blocks or using pillow.  Avoid peppermint.  Tums po prn per manufacturer can be used for immediate relief until omeprazole blood level gets to maintanence level.  Can also consider zantac 150mg  po BID prn.  Discussed if getting some relief may increase to 20mg  omeprazole DR po BID or 40mg  po daily.  Discussed signs and symptoms of heartburn/hiatal hernia with patient.  exitcare handout on GERD.Patient verbalized understanding information/instructions, agreed with plan of care and had no further questions at this time.  Patient stated told last year he was told had separating of  abdominal muscles by provider told to lose weight.  Had surgical consult x 2 and told he was not a surgical candidate at that time (general and plastic surgeon liposuction).  If  patient has abdominal pain, vomiting, hematochezia to seek same day re-evaluation.  Discussed signs and symptoms of incarcerated hernia with patient.  Exitcare handout on rectus diastasis, ventral hernia.  Avoid breath holding when lifting items.  Buddy lift items that are greater than 50 lbs.  Patient verbalized understanding information/instructions, agreed with plan of care and had no further questions at this time.  Tylenol 1000mg  po QID prn pain.  Consider biofreeze topical gel QID from clinic stock.    Home stretches recommended and gentle AROM.  knee to chest and rock side to side on back. Self massage or professional prn, foam roller use or tennis/racquetball.  Heat/cryotherapy 15 minutes QID prn.  May see RN Hildred Alamin if he wants to trial [employer] tomorrow at work.  Avoid holding static positions for extended periods stretch hourly gentle AROM.  Consider physical therapy referral if no improvement with prescribed therapy from [facility] and/or chiropractic care.  Ensure ergonomics correct desk at work avoid repetitive motions if possible/holding phone/laptop in hand use desk/stand and/or break up lifting items into smaller loads/weights.  Patient was instructed to rest, ice, and ROM exercises.  Activity as tolerated.   Follow up if symptoms persist or worsen especially if loss of bowel/bladder control, arm/leg weakness and/or saddle paresthesias.  Exitcare handout on low back pain.  Patient verbalized agreement and understanding of treatment plan and had no further questions at this time.  P2:  Injury Prevention and Fitness.

## 2019-04-29 IMAGING — NM NM HEPATO W/GB/PHARM/[PERSON_NAME]
2 series · 12 of 12 positions shown · non-contrast
Comparison: 05/24/2016 ultrasound

CLINICAL DATA: Right upper quadrant abdominal pain starting 4 weeks
ago. Nausea and vomiting.

EXAM:
NUCLEAR MEDICINE HEPATOBILIARY IMAGING WITH GALLBLADDER EF
TECHNIQUE: Sequential images of the abdomen were obtained [DATE] minutes
following intravenous administration of radiopharmaceutical. After
oral ingestion of Ensure, gallbladder ejection fraction was
determined. At 60 min, normal ejection fraction is greater than 33%.
RADIOPHARMACEUTICALS:  5.1 MCi Tc-EEm  Choletec IV

[Series 1: biliary · 4.14mm/px · 6 of 60 frames shown]
[frame 6/60]
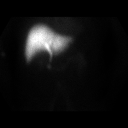
[frame 16/60]
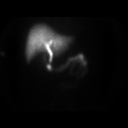
[frame 26/60]
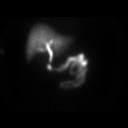
[frame 36/60]
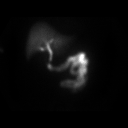
[frame 46/60]
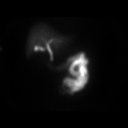
[frame 56/60]
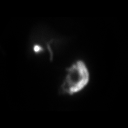

[Series 3: gbef · 4.14mm/px · 6 of 60 frames shown]
[frame 6/60]
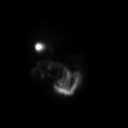
[frame 16/60]
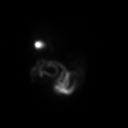
[frame 26/60]
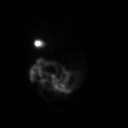
[frame 36/60]
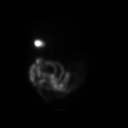
[frame 46/60]
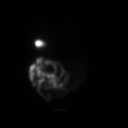
[frame 56/60]
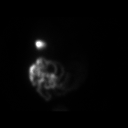

[12 of 12 positions shown; findings below may reference images not displayed]

FINDINGS: Satisfactory uptake of radiopharmaceutical from the blood pool.
Biliary activity at 5 minutes. Bowel activity at 8 minutes.
Gallbladder activity starts at about 26 minutes.

The patient experienced no symptoms drinking oral Ensure.

Calculated gallbladder ejection fraction is 28%. (Normal gallbladder
ejection fraction with Ensure is greater than 33%.)
IMPRESSION: 1. Gallbladder dysfunction, with gallbladder ejection fraction of
only 28% (normally this should be greater than 33%).

## 2019-05-02 ENCOUNTER — Ambulatory Visit: Payer: No Typology Code available for payment source | Attending: Internal Medicine

## 2019-05-02 DIAGNOSIS — Z23 Encounter for immunization: Secondary | ICD-10-CM

## 2019-05-02 NOTE — Progress Notes (Signed)
   Covid-19 Vaccination Clinic  Name:  Richard Salazar    MRN: [id number] DOB: June 26, 1960  05/02/2019  Richard Salazar was observed post Covid-19 immunization for 15 minutes without incident. He was provided with Vaccine Information Sheet and instruction to access the V-Safe system.   Richard Salazar was instructed to call 911 with any severe reactions post vaccine: Marland Kitchen Difficulty breathing  . Swelling of face and throat  . A fast heartbeat  . A bad rash all over body  . Dizziness and weakness   Immunizations Administered    Name Date Dose VIS Date Route   Pfizer COVID-19 Vaccine 05/02/2019  9:44 AM 0.3 mL 01/02/2019 Intramuscular   Manufacturer: [place]   Lot: [id number]   [place]: [id number]

## 2019-06-02 ENCOUNTER — Ambulatory Visit: Payer: No Typology Code available for payment source | Attending: Internal Medicine

## 2019-06-02 DIAGNOSIS — Z23 Encounter for immunization: Secondary | ICD-10-CM

## 2019-06-02 NOTE — Progress Notes (Signed)
   Covid-19 Vaccination Clinic  Name:  Richard Salazar    MRN: [id number] DOB: Apr 12, 1960  06/02/2019  Richard Salazar was observed post Covid-19 immunization for 15 minutes without incident. He was provided with Vaccine Information Sheet and instruction to access the V-Safe system.   Richard Salazar was instructed to call 911 with any severe reactions post vaccine: Marland Kitchen Difficulty breathing  . Swelling of face and throat  . A fast heartbeat  . A bad rash all over body  . Dizziness and weakness   Immunizations Administered    Name Date Dose VIS Date Route   Pfizer COVID-19 Vaccine 06/02/2019  8:22 AM 0.3 mL 03/18/2018 Intramuscular   Manufacturer: [place]   Lot: [id number]   [place]: [id number]

## 2020-04-12 ENCOUNTER — Ambulatory Visit: Payer: Self-pay | Admitting: *Deleted

## 2020-04-12 ENCOUNTER — Other Ambulatory Visit: Payer: Self-pay

## 2020-04-12 VITALS — BP 124/85 | HR 75

## 2020-04-12 DIAGNOSIS — Z0130 Encounter for examination of blood pressure without abnormal findings: Secondary | ICD-10-CM

## 2020-04-12 NOTE — Progress Notes (Signed)
Routine BP check per pt request.

## 2020-04-25 ENCOUNTER — Ambulatory Visit: Payer: Self-pay | Admitting: *Deleted

## 2020-04-25 ENCOUNTER — Other Ambulatory Visit: Payer: Self-pay

## 2020-04-25 VITALS — BP 128/90 | HR 68 | Ht 68.5 in | Wt 197.0 lb

## 2020-04-25 DIAGNOSIS — Z0000 Encounter for general adult medical examination without abnormal findings: Secondary | ICD-10-CM

## 2020-04-25 NOTE — Progress Notes (Signed)
[employer] insurance premium discount evaluation: Labs Drawn. Replacements ROI form signed. Tobacco Free Attestation form signed.  Forms placed in paper chart. PCP listed as Allie Bossier, NP but pt hasn't been seen in the office since 2017.

## 2020-04-26 ENCOUNTER — Encounter: Payer: Self-pay | Admitting: Registered Nurse

## 2020-04-26 LAB — CMP12+LP+TP+TSH+6AC+PSA+CBC…
ALT: 16 IU/L (ref 0–44)
AST: 21 IU/L (ref 0–40)
Albumin/Globulin Ratio: 1.7 (ref 1.2–2.2)
Albumin: 4.7 g/dL (ref 3.8–4.9)
Alkaline Phosphatase: 86 IU/L (ref 44–121)
BUN/Creatinine Ratio: 12 (ref 9–20)
BUN: 12 mg/dL (ref 6–24)
Basophils Absolute: 0 10*3/uL (ref 0.0–0.2)
Basos: 0 %
Bilirubin Total: 0.3 mg/dL (ref 0.0–1.2)
Calcium: 10.1 mg/dL (ref 8.7–10.2)
Chloride: 102 mmol/L (ref 96–106)
Chol/HDL Ratio: 3 ratio (ref 0.0–5.0)
Cholesterol, Total: 152 mg/dL (ref 100–199)
Creatinine, Ser: 1.01 mg/dL (ref 0.76–1.27)
EOS (ABSOLUTE): 0.1 10*3/uL (ref 0.0–0.4)
Eos: 1 %
Estimated CHD Risk: <0.5 times avg. (ref 0.0–1.0)
Free Thyroxine Index: 2.4 (ref 1.2–4.9)
GGT: 13 IU/L (ref 0–65)
Globulin, Total: 2.7 g/dL (ref 1.5–4.5)
Glucose: 94 mg/dL (ref 65–99)
HDL: 50 mg/dL (ref >39)
Hematocrit: 46.1 % (ref 37.5–51.0)
Hemoglobin: 15.7 g/dL (ref 13.0–17.7)
Immature Grans (Abs): 0 10*3/uL (ref 0.0–0.1)
Immature Granulocytes: 0 %
Iron: 71 ug/dL (ref 38–169)
LDH: 160 IU/L (ref 121–224)
LDL Chol Calc (NIH): 92 mg/dL (ref 0–99)
Lymphocytes Absolute: 1.7 10*3/uL (ref 0.7–3.1)
Lymphs: 36 %
MCH: 32.9 pg (ref 26.6–33.0)
MCHC: 34.1 g/dL (ref 31.5–35.7)
MCV: 97 fL (ref 79–97)
Monocytes Absolute: 0.6 10*3/uL (ref 0.1–0.9)
Monocytes: 12 %
Neutrophils Absolute: 2.4 10*3/uL (ref 1.4–7.0)
Neutrophils: 51 %
Phosphorus: 3.2 mg/dL (ref 2.8–4.1)
Platelets: 205 10*3/uL (ref 150–450)
Potassium: 4.3 mmol/L (ref 3.5–5.2)
Prostate Specific Ag, Serum: 0.9 ng/mL (ref 0.0–4.0)
RBC: 4.77 x10E6/uL (ref 4.14–5.80)
RDW: 11.4 % — ABNORMAL LOW (ref 11.6–15.4)
Sodium: 139 mmol/L (ref 134–144)
T3 Uptake Ratio: 35 % (ref 24–39)
T4, Total: 6.9 ug/dL (ref 4.5–12.0)
TSH: 1.94 u[IU]/mL (ref 0.450–4.500)
Total Protein: 7.4 g/dL (ref 6.0–8.5)
Triglycerides: 45 mg/dL (ref 0–149)
Uric Acid: 5.7 mg/dL (ref 3.8–8.4)
VLDL Cholesterol Cal: 10 mg/dL (ref 5–40)
WBC: 4.8 10*3/uL (ref 3.4–10.8)
eGFR: 86 mL/min/{1.73_m2} (ref >59)

## 2020-04-26 LAB — HGB A1C W/O EAG: Hgb A1c MFr Bld: 5.7 % — ABNORMAL HIGH (ref 4.8–5.6)

## 2020-08-19 DIAGNOSIS — K635 Polyp of colon: Secondary | ICD-10-CM | POA: Insufficient documentation

## 2020-12-02 ENCOUNTER — Encounter: Payer: Self-pay | Admitting: *Deleted

## 2020-12-05 ENCOUNTER — Ambulatory Visit: Payer: No Typology Code available for payment source | Admitting: Anesthesiology

## 2020-12-05 ENCOUNTER — Encounter: Payer: Self-pay | Admitting: *Deleted

## 2020-12-05 ENCOUNTER — Ambulatory Visit
Admission: RE | Admit: 2020-12-05 | Discharge: 2020-12-05 | Disposition: A | Discharge status: Home / Self Care | Payer: No Typology Code available for payment source | Source: Ambulatory Visit | Attending: Gastroenterology | Admitting: Gastroenterology

## 2020-12-05 ENCOUNTER — Encounter
Admission: RE | Disposition: A | Discharge status: Home / Self Care | Payer: Self-pay | Source: Ambulatory Visit | Attending: Gastroenterology

## 2020-12-05 ENCOUNTER — Other Ambulatory Visit: Payer: Self-pay

## 2020-12-05 DIAGNOSIS — Z1211 Encounter for screening for malignant neoplasm of colon: Secondary | ICD-10-CM | POA: Diagnosis not present

## 2020-12-05 DIAGNOSIS — K2950 Unspecified chronic gastritis without bleeding: Secondary | ICD-10-CM | POA: Diagnosis not present

## 2020-12-05 DIAGNOSIS — K219 Gastro-esophageal reflux disease without esophagitis: Secondary | ICD-10-CM | POA: Insufficient documentation

## 2020-12-05 DIAGNOSIS — K5730 Diverticulosis of large intestine without perforation or abscess without bleeding: Secondary | ICD-10-CM | POA: Insufficient documentation

## 2020-12-05 DIAGNOSIS — H9192 Unspecified hearing loss, left ear: Secondary | ICD-10-CM | POA: Insufficient documentation

## 2020-12-05 DIAGNOSIS — K3189 Other diseases of stomach and duodenum: Secondary | ICD-10-CM | POA: Insufficient documentation

## 2020-12-05 DIAGNOSIS — R1084 Generalized abdominal pain: Secondary | ICD-10-CM | POA: Diagnosis present

## 2020-12-05 DIAGNOSIS — Z86010 Personal history of colonic polyps: Secondary | ICD-10-CM | POA: Insufficient documentation

## 2020-12-05 DIAGNOSIS — K640 First degree hemorrhoids: Secondary | ICD-10-CM | POA: Diagnosis not present

## 2020-12-05 DIAGNOSIS — N400 Enlarged prostate without lower urinary tract symptoms: Secondary | ICD-10-CM | POA: Insufficient documentation

## 2020-12-05 HISTORY — PX: COLONOSCOPY WITH PROPOFOL: SHX5780

## 2020-12-05 HISTORY — DX: Other chronic pain: G89.29

## 2020-12-05 HISTORY — PX: ESOPHAGOGASTRODUODENOSCOPY (EGD) WITH PROPOFOL: SHX5813

## 2020-12-05 SURGERY — COLONOSCOPY WITH PROPOFOL
Anesthesia: General

## 2020-12-05 MED ORDER — PROPOFOL 10 MG/ML IV BOLUS
INTRAVENOUS | Status: DC | PRN
  Administered 2020-12-05: 50 mg via INTRAVENOUS

## 2020-12-05 MED ORDER — PROPOFOL 500 MG/50ML IV EMUL
INTRAVENOUS | Status: DC | PRN
  Administered 2020-12-05: 50 ug/kg/min via INTRAVENOUS

## 2020-12-05 MED ORDER — SODIUM CHLORIDE 0.9 % IV SOLN: INTRAVENOUS | Status: DC

## 2020-12-05 MED ORDER — FENTANYL CITRATE (PF) 100 MCG/2ML IJ SOLN
INTRAMUSCULAR | Status: DC | PRN
  Administered 2020-12-05 (×4): 25 ug via INTRAVENOUS

## 2020-12-05 MED ORDER — MIDAZOLAM HCL 2 MG/2ML IJ SOLN
INTRAMUSCULAR | Status: AC
  Filled 2020-12-05: qty 2

## 2020-12-05 MED ORDER — FENTANYL CITRATE (PF) 100 MCG/2ML IJ SOLN
INTRAMUSCULAR | Status: AC
  Filled 2020-12-05: qty 2

## 2020-12-05 MED ORDER — LIDOCAINE HCL (CARDIAC) PF 100 MG/5ML IV SOSY
PREFILLED_SYRINGE | INTRAVENOUS | Status: DC | PRN
  Administered 2020-12-05: 50 mg via INTRAVENOUS

## 2020-12-05 MED ORDER — PROPOFOL 500 MG/50ML IV EMUL
INTRAVENOUS | Status: AC
  Filled 2020-12-05: qty 50

## 2020-12-05 MED ORDER — MIDAZOLAM HCL 2 MG/2ML IJ SOLN
INTRAMUSCULAR | Status: DC | PRN
  Administered 2020-12-05: 2 mg via INTRAVENOUS

## 2020-12-05 NOTE — Op Note (Signed)
[facility] Gastroenterology Patient Name: Richard Salazar Procedure Date: 12/05/2020 8:18 AM MRN: [id number] Account #: [id number] Date of Birth: 1960-10-05 Admit Type: Outpatient Age: 60 Room: [facility] ENDO ROOM 3 Gender: Male Note Status: Finalized Instrument Name: Upper Endoscope [id number] Procedure:             Upper GI endoscopy Indications:           Generalized abdominal pain Providers:             Andrey Farmer MD, MD Referring MD:          Pleas Koch (Referring MD) Medicines:             Monitored Anesthesia Care Complications:         No immediate complications. Estimated blood loss:                         Minimal. Procedure:             Pre-Anesthesia Assessment:                        - Prior to the procedure, a History and Physical was                         performed, and patient medications and allergies were                         reviewed. The patient is competent. The risks and                         benefits of the procedure and the sedation options and                         risks were discussed with the patient. All questions                         were answered and informed consent was obtained.                         Patient identification and proposed procedure were                         verified by the physician, the nurse, the anesthetist                         and the technician in the endoscopy suite. Mental                         Status Examination: alert and oriented. Airway                         Examination: normal oropharyngeal airway and neck                         mobility. Respiratory Examination: clear to                         auscultation. CV Examination: normal. Prophylactic  Antibiotics: The patient does not require prophylactic                         antibiotics. Prior Anticoagulants: The patient has                         taken no previous anticoagulant or antiplatelet                          agents. ASA Grade Assessment: I - A normal, healthy                         patient. After reviewing the risks and benefits, the                         patient was deemed in satisfactory condition to                         undergo the procedure. The anesthesia plan was to use                         monitored anesthesia care (MAC). Immediately prior to                         administration of medications, the patient was                         re-assessed for adequacy to receive sedatives. The                         heart rate, respiratory rate, oxygen saturations,                         blood pressure, adequacy of pulmonary ventilation, and                         response to care were monitored throughout the                         procedure. The physical status of the patient was                         re-assessed after the procedure.                        After obtaining informed consent, the endoscope was                         passed under direct vision. Throughout the procedure,                         the patient's blood pressure, pulse, and oxygen                         saturations were monitored continuously. The Endoscope                         was introduced through the mouth, and advanced to the  second part of duodenum. The upper GI endoscopy was                         accomplished without difficulty. The patient tolerated                         the procedure well. Findings:      The examined esophagus was normal.      Patchy minimal inflammation characterized by erythema was found in the       gastric antrum. Biopsies were taken with a cold forceps for Helicobacter       pylori testing. Estimated blood loss was minimal.      Localized mild mucosal variance characterized by congestion was found in       the second portion of the duodenum. Biopsies were taken with a cold       forceps for histology. Estimated blood loss was  minimal.      The exam of the duodenum was otherwise normal. Impression:            - Normal esophagus.                        - Gastritis. Biopsied.                        - Mucosal variant in the duodenum. Biopsied. Recommendation:        - Perform a colonoscopy today. Procedure Code(s):     --- Professional ---                        [phone], Esophagogastroduodenoscopy, flexible,                         transoral; with biopsy, single or multiple Diagnosis Code(s):     --- Professional ---                        K29.70, Gastritis, unspecified, without bleeding                        K31.89, Other diseases of stomach and duodenum                        R10.84, Generalized abdominal pain CPT copyright 2019 American Medical Association. All rights reserved. The codes documented in this report are preliminary and upon coder review may  be revised to meet current compliance requirements. Andrey Farmer MD, MD 12/05/2020 8:51:54 AM Number of Addenda: 0 Note Initiated On: 12/05/2020 8:18 AM Estimated Blood Loss:  Estimated blood loss was minimal.      [facility]

## 2020-12-05 NOTE — Anesthesia Postprocedure Evaluation (Signed)
Anesthesia Post Note  Patient: Richard Salazar  Procedure(s) Performed: COLONOSCOPY WITH PROPOFOL ESOPHAGOGASTRODUODENOSCOPY (EGD) WITH PROPOFOL  Patient location during evaluation: Endoscopy Anesthesia Type: General Level of consciousness: awake and alert Pain management: pain level controlled Vital Signs Assessment: post-procedure vital signs reviewed and stable Respiratory status: spontaneous breathing, nonlabored ventilation, respiratory function stable and patient connected to nasal cannula oxygen Cardiovascular status: blood pressure returned to baseline and stable Postop Assessment: no apparent nausea or vomiting Anesthetic complications: no   No notable events documented.   Last Vitals:  Vitals:   12/05/20 0859 12/05/20 0909  BP: 123/71 129/86  Pulse: 74 70  Resp: 11 12  Temp:    SpO2: 96% 97%    Last Pain:  Vitals:   12/05/20 0909  TempSrc:   PainSc: 0-No pain                 Precious Haws Cassanda Walmer

## 2020-12-05 NOTE — H&P (Signed)
Outpatient short stay form Pre-procedure 12/05/2020  Richard Rubenstein, MD  Primary Physician: Pleas Koch, NP  Reason for visit:  Abdominal pain/history of polyp  History of present illness:   60 y/o gentleman with history of allergies here for EGD/Colonoscopy for abdominal pain and history of adenomatous colon polyp on colonoscopy done 6 years ago. No family history of GI malignancies. No abdominal surgeries. Brother has crohns disease.    Current Facility-Administered Medications:    0.9 %  sodium chloride infusion, , Intravenous, Continuous, Ricca Melgarejo, Hilton Cork, MD, Last Rate: 20 mL/hr at 12/05/20 0739, New Bag at 12/05/20 0739  Medications Prior to Admission  Medication Sig Dispense Refill Last Dose   fluticasone (FLONASE) 50 MCG/ACT nasal spray Place 1 spray into both nostrils 2 (two) times daily. (Patient not taking: Reported on 04/25/2020) 16 g 6    ibuprofen (ADVIL,MOTRIN) 200 MG tablet Take 400 mg by mouth.   12/01/2020   loratadine (CLARITIN) 10 MG tablet Take 1 tablet (10 mg total) by mouth daily. (Patient not taking: Reported on 04/25/2020) 30 tablet 11    Multiple Vitamin (MULTIVITAMIN) capsule Take 1 capsule by mouth daily.   12/03/2020   sodium chloride (OCEAN) 0.65 % SOLN nasal spray Place 2 sprays into both nostrils every 2 (two) hours while awake. (Patient not taking: Reported on 04/25/2020)  0      No Known Allergies   Past Medical History:  Diagnosis Date   BPH (benign prostatic hyperplasia)    Chicken pox    Chronic back pain    Deafness    Left ear, loss   GERD (gastroesophageal reflux disease)    Kidney stones    Migraines    Shingles    15 years ago   Ventral hernia     Review of systems:  Otherwise negative.    Physical Exam  Gen: Alert, oriented. Appears stated age.  HEENT: PERRLA. Lungs: No respiratory distress CV: RRR Abd: soft, benign, no masses Ext: No edema    Planned procedures: Proceed with colonoscopy. The patient  understands the nature of the planned procedure, indications, risks, alternatives and potential complications including but not limited to bleeding, infection, perforation, damage to internal organs and possible oversedation/side effects from anesthesia. The patient agrees and gives consent to proceed.  Please refer to procedure notes for findings, recommendations and patient disposition/instructions.     Richard Rubenstein, MD [facility]

## 2020-12-05 NOTE — Transfer of Care (Signed)
Immediate Anesthesia Transfer of Care Note  Patient: ISAIC SYLER  Procedure(s) Performed: COLONOSCOPY WITH PROPOFOL ESOPHAGOGASTRODUODENOSCOPY (EGD) WITH PROPOFOL  Patient Location: PACU  Anesthesia Type:General  Level of Consciousness: sedated  Airway & Oxygen Therapy: Patient Spontanous Breathing and Patient connected to nasal cannula oxygen  Post-op Assessment: Report given to RN and Post -op Vital signs reviewed and stable  Post vital signs: Reviewed and stable  Last Vitals:  Vitals Value Taken Time  BP 124/82 12/05/20 0849  Temp    Pulse 77 12/05/20 0849  Resp 9 12/05/20 0849  SpO2 93 % 12/05/20 0849    Last Pain:  Vitals:   12/05/20 0849  TempSrc:   PainSc: Asleep         Complications: No notable events documented.

## 2020-12-05 NOTE — Interval H&P Note (Signed)
History and Physical Interval Note:  12/05/2020 8:21 AM  Richard Salazar  has presented today for surgery, with the diagnosis of UPPER ABDOMINAL PAIN;HISTORY OF ADENOMATOUS POLYP OF COLON.  The various methods of treatment have been discussed with the patient and family. After consideration of risks, benefits and other options for treatment, the patient has consented to  Procedure(s): COLONOSCOPY WITH PROPOFOL (N/A) ESOPHAGOGASTRODUODENOSCOPY (EGD) WITH PROPOFOL (N/A) as a surgical intervention.  The patient's history has been reviewed, patient examined, no change in status, stable for surgery.  I have reviewed the patient's chart and labs.  Questions were answered to the patient's satisfaction.     Lesly Rubenstein  Ok to proceed with EGD/Colonoscopy

## 2020-12-05 NOTE — Anesthesia Preprocedure Evaluation (Signed)
Anesthesia Evaluation  Patient identified by MRN, date of birth, ID band Patient awake    Reviewed: Allergy & Precautions, NPO status , Patient's Chart, lab work & pertinent test results  History of Anesthesia Complications Negative for: history of anesthetic complications  Airway Mallampati: III  TM Distance: >3 FB Neck ROM: full    Dental  (+) Chipped   Pulmonary neg pulmonary ROS, neg shortness of breath,    Pulmonary exam normal        Cardiovascular negative cardio ROS Normal cardiovascular exam     Neuro/Psych  Headaches, negative psych ROS   GI/Hepatic Neg liver ROS, GERD  Medicated and Controlled,  Endo/Other  negative endocrine ROS  Renal/GU Renal disease  negative genitourinary   Musculoskeletal   Abdominal   Peds  Hematology negative hematology ROS (+)   Anesthesia Other Findings Past Medical History: No date: BPH (benign prostatic hyperplasia) No date: Chicken pox No date: Chronic back pain No date: Deafness     Comment:  Left ear, loss No date: GERD (gastroesophageal reflux disease) No date: Kidney stones No date: Migraines No date: Shingles     Comment:  15 years ago No date: Ventral hernia  Past Surgical History: No date: BACK SURGERY 07/09/2014: COLONOSCOPY; N/A     Comment:  Procedure: COLONOSCOPY;  Surgeon: Manya Silvas, MD;               Location: [facility] END[facility]COPY;  Service: Endoscopy;                Laterality: N/A; No date: LUMBAR DISC SURGERY     Comment:  2004 No date: SPINE SURGERY     Reproductive/Obstetrics negative OB ROS                             Anesthesia Physical Anesthesia Plan  ASA: 2  Anesthesia Plan: General   Post-op Pain Management:    Induction: Intravenous  PONV Risk Score and Plan: Propofol infusion and TIVA  Airway Management Planned: Natural Airway and Nasal Cannula  Additional Equipment:   Intra-op Plan:    Post-operative Plan:   Informed Consent: I have reviewed the patients History and Physical, chart, labs and discussed the procedure including the risks, benefits and alternatives for the proposed anesthesia with the patient or authorized representative who has indicated his/her understanding and acceptance.     Dental Advisory Given  Plan Discussed with: Anesthesiologist, CRNA and Surgeon  Anesthesia Plan Comments: (Patient consented for risks of anesthesia including but not limited to:  - adverse reactions to medications - risk of airway placement if required - damage to eyes, teeth, lips or other oral mucosa - nerve damage due to positioning  - sore throat or hoarseness - Damage to heart, brain, nerves, lungs, other parts of body or loss of life  Patient voiced understanding.)        Anesthesia Quick Evaluation

## 2020-12-05 NOTE — Op Note (Signed)
[facility] Patient Name: Richard Salazar Procedure Date: 12/05/2020 8:17 AM MRN: [id number] Account #: [id number] Date of Birth: 05-01-1960 Admit Type: Outpatient Age: 60 Room: [facility] ENDO ROOM 3 Gender: Male Note Status: Finalized Instrument Name: Jasper Riling [id number] Procedure:             Colonoscopy Indications:           Surveillance: Personal history of adenomatous polyps                         on last colonoscopy > 5 years ago Providers:             Andrey Farmer MD, MD Referring MD:          Pleas Koch (Referring MD) Medicines:             Monitored Anesthesia Care Complications:         No immediate complications. Procedure:             Pre-Anesthesia Assessment:                        - Prior to the procedure, a History and Physical was                         performed, and patient medications and allergies were                         reviewed. The patient is competent. The risks and                         benefits of the procedure and the sedation options and                         risks were discussed with the patient. All questions                         were answered and informed consent was obtained.                         Patient identification and proposed procedure were                         verified by the physician, the nurse, the anesthetist                         and the technician in the endoscopy suite. Mental                         Status Examination: alert and oriented. Airway                         Examination: normal oropharyngeal airway and neck                         mobility. Respiratory Examination: clear to                         auscultation. CV Examination: normal. Prophylactic  Antibiotics: The patient does not require prophylactic                         antibiotics. Prior Anticoagulants: The patient has                         taken no previous anticoagulant or  antiplatelet                         agents. ASA Grade Assessment: I - A normal, healthy                         patient. After reviewing the risks and benefits, the                         patient was deemed in satisfactory condition to                         undergo the procedure. The anesthesia plan was to use                         monitored anesthesia care (MAC). Immediately prior to                         administration of medications, the patient was                         re-assessed for adequacy to receive sedatives. The                         heart rate, respiratory rate, oxygen saturations,                         blood pressure, adequacy of pulmonary ventilation, and                         response to care were monitored throughout the                         procedure. The physical status of the patient was                         re-assessed after the procedure.                        After obtaining informed consent, the colonoscope was                         passed under direct vision. Throughout the procedure,                         the patient's blood pressure, pulse, and oxygen                         saturations were monitored continuously. The                         Colonoscope was introduced through the anus and  advanced to the the terminal ileum. The colonoscopy                         was performed without difficulty. The patient                         tolerated the procedure well. The quality of the bowel                         preparation was good. Findings:      The perianal and digital rectal examinations were normal.      The terminal ileum appeared normal.      Multiple small-mouthed diverticula were found in the sigmoid colon.      Internal hemorrhoids were found during retroflexion. The hemorrhoids       were Grade I (internal hemorrhoids that do not prolapse).      The exam was otherwise without abnormality on direct and  retroflexion       views. Impression:            - The examined portion of the ileum was normal.                        - Diverticulosis in the sigmoid colon.                        - Internal hemorrhoids.                        - The examination was otherwise normal on direct and                         retroflexion views.                        - No specimens collected. Recommendation:        - Discharge patient to home.                        - Resume previous diet.                        - Continue present medications.                        - Repeat colonoscopy in 10 years for surveillance.                        - Return to referring physician as previously                         scheduled. Procedure Code(s):     --- Professional ---                        Z6109, Colorectal cancer screening; colonoscopy on                         individual at high risk Diagnosis Code(s):     --- Professional ---                        Z86.010,  Personal history of colonic polyps                        K64.0, First degree hemorrhoids                        K57.30, Diverticulosis of large intestine without                         perforation or abscess without bleeding CPT copyright 2019 [employer]. All rights reserved. The codes documented in this report are preliminary and upon coder review may  be revised to meet current compliance requirements. Andrey Farmer MD, MD 12/05/2020 8:54:21 AM Number of Addenda: 0 Note Initiated On: 12/05/2020 8:17 AM Scope Withdrawal Time: 0 hours 8 minutes 50 seconds  Total Procedure Duration: 0 hours 11 minutes 33 seconds  Estimated Blood Loss:  Estimated blood loss: none.      [facility]

## 2020-12-06 ENCOUNTER — Encounter: Payer: Self-pay | Admitting: Gastroenterology

## 2020-12-06 LAB — SURGICAL PATHOLOGY

## 2021-01-08 ENCOUNTER — Telehealth: Payer: Self-pay | Admitting: Registered Nurse

## 2021-01-08 ENCOUNTER — Encounter: Payer: Self-pay | Admitting: Registered Nurse

## 2021-01-08 DIAGNOSIS — Z20822 Contact with and (suspected) exposure to covid-19: Secondary | ICD-10-CM

## 2021-01-08 NOTE — Telephone Encounter (Signed)
Left message for patient regarding retest in 5 days (Friday).  01/13/21.  I recommend sanitizing high touch surfaces once a day and quarantining separately from spouse.  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  Straw under mask at work for beverages  Patient may contact me at [phone] or RN Hildred Alamin tomorrow at [phone] if further questions or concerns.  [place] and cell service intermittent until evening drive home.  HR notified mask x 10 days/monitor symptoms and retest 12/23.

## 2021-01-13 NOTE — Telephone Encounter (Signed)
Today Day 5. Has not done his post-exposure testing yet. Will perform and RN will call back prior to clinic close today for results. Reports he is still asymptomatic. Has a cough but this is long standing due to GERD. No change in this cough since exposure.

## 2021-01-16 NOTE — Telephone Encounter (Signed)
Reviewed RN Hildred Alamin note patient still asymptomatic and plans to do testing when he returns home today.  Continuing mask wear.  HR notified.

## 2021-01-19 NOTE — Telephone Encounter (Signed)
Test 12/26 was negative. Asymptomatic still. Day 10 was yesterday. Closing encounter.

## 2021-01-20 NOTE — Telephone Encounter (Signed)
Day 10 12/28 continues asymptomatic encounter closed.

## 2021-06-06 ENCOUNTER — Other Ambulatory Visit: Payer: Self-pay | Admitting: Registered Nurse

## 2021-06-06 DIAGNOSIS — R7303 Prediabetes: Secondary | ICD-10-CM

## 2021-06-07 LAB — CMP12+LP+TP+TSH+6AC+PSA+CBC…
ALT: 17 IU/L (ref 0–44)
AST: 23 IU/L (ref 0–40)
Albumin/Globulin Ratio: 1.6 (ref 1.2–2.2)
Albumin: 4.5 g/dL (ref 3.8–4.8)
Alkaline Phosphatase: 80 IU/L (ref 44–121)
BUN/Creatinine Ratio: 13 (ref 10–24)
BUN: 12 mg/dL (ref 8–27)
Basophils Absolute: 0 10*3/uL (ref 0.0–0.2)
Basos: 0 %
Bilirubin Total: 0.5 mg/dL (ref 0.0–1.2)
Calcium: 10.1 mg/dL (ref 8.6–10.2)
Chloride: 104 mmol/L (ref 96–106)
Chol/HDL Ratio: 3.7 ratio (ref 0.0–5.0)
Cholesterol, Total: 160 mg/dL (ref 100–199)
Creatinine, Ser: 0.91 mg/dL (ref 0.76–1.27)
EOS (ABSOLUTE): 0.1 10*3/uL (ref 0.0–0.4)
Eos: 1 %
Estimated CHD Risk: 0.6 times avg. (ref 0.0–1.0)
Free Thyroxine Index: 2.3 (ref 1.2–4.9)
GGT: 16 IU/L (ref 0–65)
Globulin, Total: 2.9 g/dL (ref 1.5–4.5)
Glucose: 94 mg/dL (ref 70–99)
HDL: 43 mg/dL (ref >39)
Hematocrit: 44.3 % (ref 37.5–51.0)
Hemoglobin: 15.6 g/dL (ref 13.0–17.7)
Immature Grans (Abs): 0 10*3/uL (ref 0.0–0.1)
Immature Granulocytes: 0 %
Iron: 92 ug/dL (ref 38–169)
LDH: 160 IU/L (ref 121–224)
LDL Chol Calc (NIH): 101 mg/dL — ABNORMAL HIGH (ref 0–99)
Lymphocytes Absolute: 1.9 10*3/uL (ref 0.7–3.1)
Lymphs: 41 %
MCH: 33.5 pg — ABNORMAL HIGH (ref 26.6–33.0)
MCHC: 35.2 g/dL (ref 31.5–35.7)
MCV: 95 fL (ref 79–97)
Monocytes Absolute: 0.6 10*3/uL (ref 0.1–0.9)
Monocytes: 13 %
Neutrophils Absolute: 2.1 10*3/uL (ref 1.4–7.0)
Neutrophils: 45 %
Phosphorus: 3.1 mg/dL (ref 2.8–4.1)
Platelets: 215 10*3/uL (ref 150–450)
Potassium: 4.4 mmol/L (ref 3.5–5.2)
Prostate Specific Ag, Serum: 0.7 ng/mL (ref 0.0–4.0)
RBC: 4.65 x10E6/uL (ref 4.14–5.80)
RDW: 12.2 % (ref 11.6–15.4)
Sodium: 143 mmol/L (ref 134–144)
T3 Uptake Ratio: 34 % (ref 24–39)
T4, Total: 6.7 ug/dL (ref 4.5–12.0)
TSH: 1.77 u[IU]/mL (ref 0.450–4.500)
Total Protein: 7.4 g/dL (ref 6.0–8.5)
Triglycerides: 83 mg/dL (ref 0–149)
Uric Acid: 6.1 mg/dL (ref 3.8–8.4)
VLDL Cholesterol Cal: 16 mg/dL (ref 5–40)
WBC: 4.7 10*3/uL (ref 3.4–10.8)
eGFR: 96 mL/min/{1.73_m2} (ref >59)

## 2021-06-07 LAB — HGB A1C W/O EAG: Hgb A1c MFr Bld: 5.7 % — ABNORMAL HIGH (ref 4.8–5.6)

## 2021-06-07 LAB — VITAMIN D 25 HYDROXY (VIT D DEFICIENCY, FRACTURES): Vit D, 25-Hydroxy: 39.4 ng/mL (ref 30.0–100.0)

## 2021-06-09 ENCOUNTER — Encounter: Payer: Self-pay | Admitting: Registered Nurse

## 2021-06-09 DIAGNOSIS — R7303 Prediabetes: Secondary | ICD-10-CM | POA: Insufficient documentation

## 2021-06-09 NOTE — Addendum Note (Signed)
Addended by: Gerarda Fraction A on: 06/09/2021 10:56 PM   Modules accepted: Orders, Level of Service

## 2021-06-23 ENCOUNTER — Ambulatory Visit: Payer: Self-pay | Admitting: *Deleted

## 2021-06-23 VITALS — BP 126/91 | HR 78 | Ht 68.5 in | Wt 208.0 lb

## 2021-06-23 DIAGNOSIS — Z0000 Encounter for general adult medical examination without abnormal findings: Secondary | ICD-10-CM

## 2021-06-23 NOTE — Progress Notes (Signed)
[employer] insurance premium discount evaluation: Labs Drawn onsite by [facility] previously. Replacements ROI form signed. Tobacco Free Attestation form signed.  Forms placed in paper chart.

## 2021-10-30 ENCOUNTER — Ambulatory Visit: Payer: No Typology Code available for payment source

## 2021-10-30 VITALS — BP 157/96 | HR 77

## 2021-10-30 DIAGNOSIS — Z0130 Encounter for examination of blood pressure without abnormal findings: Secondary | ICD-10-CM

## 2021-10-31 ENCOUNTER — Ambulatory Visit: Payer: Self-pay | Admitting: Registered Nurse

## 2021-10-31 ENCOUNTER — Encounter: Payer: Self-pay | Admitting: Registered Nurse

## 2021-10-31 VITALS — BP 112/83 | HR 83 | Temp 99.0°F | Resp 16

## 2021-10-31 DIAGNOSIS — H6691 Otitis media, unspecified, right ear: Secondary | ICD-10-CM

## 2021-10-31 DIAGNOSIS — R030 Elevated blood-pressure reading, without diagnosis of hypertension: Secondary | ICD-10-CM

## 2021-10-31 DIAGNOSIS — R42 Dizziness and giddiness: Secondary | ICD-10-CM

## 2021-10-31 MED ORDER — AMOXICILLIN-POT CLAVULANATE 875-125 MG PO TABS: 1.0000 | ORAL_TABLET | Freq: Two times a day (BID) | ORAL | 0 refills | Status: AC

## 2021-10-31 NOTE — Progress Notes (Signed)
   Subjective:    Patient ID: Richard Salazar, male    DOB: [birth year], 61 y.o.   MRN: [id number]  HPI    Review of Systems     Objective:   Physical Exam        Assessment & Plan:

## 2021-10-31 NOTE — Patient Instructions (Signed)
Preventing Hypertension Hypertension, also called high blood pressure, is when the force of blood pumping through the arteries is too strong. Arteries are blood vessels that carry blood from the heart throughout the body. Often, hypertension does not cause symptoms until blood pressure is very high. It is important to have your blood pressure checked regularly. Diet and lifestyle changes can help you prevent hypertension, and they may make you feel better overall and improve your quality of life. If you already have hypertension, you may control it with diet and lifestyle changes, as well as with medicine. How can this condition affect me? Over time, hypertension can damage the arteries and decrease blood flow to important parts of the body, including the brain, heart, and kidneys. By keeping your blood pressure in a healthy range, you can help prevent complications like heart attack, heart failure, stroke, kidney failure, and vascular dementia. What can increase my risk? An unhealthy diet and a lack of physical activity can make you more likely to develop high blood pressure. Some other risk factors include: Age. The risk increases with age. Having family members who have had high blood pressure. Having certain health conditions, such as thyroid problems. Being overweight or obese. Drinking too much alcohol or caffeine. Having too much fat, sugar, calories, or salt (sodium) in your diet. Smoking or using illegal drugs. Taking certain medicines, such as antidepressants, decongestants, birth control pills, and NSAIDs, such as ibuprofen. What actions can I take to prevent or manage this condition? Work with your health care provider to make a hypertension prevention plan that works for you. You may be referred for counseling on a healthy diet and physical activity. Follow your plan and keep all follow-up visits. Diet changes Maintain a healthy diet. This includes: Eating less salt (sodium). Ask your  health care provider how much sodium is safe for you to have. The general recommendation is to have less than 1 tsp (2,300 mg) of sodium a day. Do not add salt to your food. Choose low-sodium options when grocery shopping and eating out. Limiting fats in your diet. You can do this by eating low-fat or fat-free dairy products and by eating less red meat. Eating more fruits, vegetables, and whole grains. Make a goal to eat: 1-2 cups of fresh fruits and vegetables each day. 3-4 servings of whole grains each day. Avoiding foods and beverages that have added sugars. Eating fish that contain healthy fats (omega-3 fatty acids), such as mackerel or salmon. If you need help putting together a healthy eating plan, try the DASH diet. This diet is high in fruits, vegetables, and whole grains. It is low in sodium, red meat, and added sugars. DASH stands for Dietary Approaches to Stop Hypertension. Lifestyle changes  Lose weight if you are overweight. Losing just 3-5% of your body weight can help prevent or control hypertension. For example, if your present weight is 200 lb (91 kg), a loss of 3-5% of your weight means losing 6-10 lb (2.7-4.5 kg). Ask your health care provider to help you with a diet and exercise plan to safely lose weight. Get enough exercise. Do at least 150 minutes of moderate-intensity exercise each week. You could do this in short exercise sessions several times a day, or you could do longer exercise sessions a few times a week. For example, you could take a brisk 10-minute walk or bike ride, 3 times a day, for 5 days a week. Find ways to reduce stress, such as exercising, meditating, listening to   music, or taking a yoga class. If you need help reducing stress, ask your health care provider. Do not use any products that contain nicotine or tobacco. These products include cigarettes, chewing tobacco, and vaping devices, such as e-cigarettes. Chemicals in tobacco and nicotine products raise your  blood pressure each time you use them. If you need help quitting, ask your health care provider. Learn how to check your blood pressure at home. Make sure that you know your personal target blood pressure, as told by your health care provider. Try to sleep 7-9 hours per night. Alcohol use Do not drink alcohol if: Your health care provider tells you not to drink. You are pregnant, may be pregnant, or are planning to become pregnant. If you drink alcohol: Limit how much you have to: 0-1 drink a day for women. 0-2 drinks a day for men. Know how much alcohol is in your drink. In the [place] one drink equals one 12 oz bottle of beer (355 mL), one 5 oz glass of wine (148 mL), or one 1 oz glass of hard liquor (44 mL). Medicines In addition to diet and lifestyle changes, your health care provider may recommend medicines to help lower your blood pressure. In general: You may need to try a few different medicines to find what works best for you. You may need to take more than one medicine. Take over-the-counter and prescription medicines only as told by your health care provider. Questions to ask your health care provider What is my blood pressure goal? How can I lower my risk for high blood pressure? How should I monitor my blood pressure at home? Where to find support Your health care provider can help you prevent hypertension and help you keep your blood pressure at a healthy level. Your local hospital or your community may also provide support services and prevention programs. The American Heart Association offers an online support network at supportnetwork.heart.org Where to find more information Learn more about hypertension from: [place], Lung, and [place]: https://wilson-eaton.com/ Centers for Disease Control and Prevention: http://www.wolf.info/ American Academy of Family Physicians: familydoctor.org Learn more about the DASH diet from: [place], Lung, and [place]:  https://wilson-eaton.com/ Contact a health care provider if: You think you are having a reaction to medicines you have taken. You have recurrent headaches or feel dizzy. You have swelling in your ankles. You have trouble with your vision. Get help right away if: You have sudden, severe chest, back, or abdominal pain or discomfort. You have shortness of breath. You have a sudden, severe headache. These symptoms may be an emergency. Get help right away. Call 911. Do not wait to see if the symptoms will go away. Do not drive yourself to the hospital. Summary Hypertension often does not cause any symptoms until blood pressure is very high. It is important to get your blood pressure checked regularly. Diet and lifestyle changes are important steps in preventing hypertension. By keeping your blood pressure in a healthy range, you may prevent complications like heart attack, heart failure, stroke, and kidney failure. Work with your health care provider to make a hypertension prevention plan that works for you. This information is not intended to replace advice given to you by your health care provider. Make sure you discuss any questions you have with your health care provider. Document Revised: 10/27/2020 Document Reviewed: 10/27/2020 Elsevier Patient Education  [place]. Otitis Media, Adult  Otitis media occurs when there is inflammation and fluid in the middle ear  with signs and symptoms of an acute infection. The middle ear is a part of the ear that contains bones for hearing as well as air that helps send sounds to the brain. When infected fluid builds up in this space, it causes pressure and can lead to an ear infection. The eustachian tube connects the middle ear to the back of the nose (nasopharynx) and normally allows air into the middle ear. If the eustachian tube becomes blocked, fluid can build up and become infected. What are the causes? This condition is caused by a blockage in the  eustachian tube. This can be caused by mucus or by swelling of the tube. Problems that can cause a blockage include: A cold or other upper respiratory infection. Allergies. An irritant, such as tobacco smoke. Enlarged adenoids. The adenoids are areas of soft tissue located high in the back of the throat, behind the nose and the roof of the mouth. They are part of the body's defense system (immune system). A mass in the nasopharynx. Damage to the ear caused by pressure changes (barotrauma). What increases the risk? You are more likely to develop this condition if you: Smoke or are exposed to tobacco smoke. Have an opening in the roof of your mouth (cleft palate). Have gastroesophageal reflux. Have an immune system disorder. What are the signs or symptoms? Symptoms of this condition include: Ear pain. Fever. Decreased hearing. Tiredness (lethargy). Fluid leaking from the ear, if the eardrum is ruptured or has burst. Ringing in the ear. How is this diagnosed?  This condition is diagnosed with a physical exam. During the exam, your health care provider will use an instrument called an otoscope to look in your ear and check for redness, swelling, and fluid. He or she will also ask about your symptoms. Your health care provider may also order tests, such as: A pneumatic otoscopy. This is a test to check the movement of the eardrum. It is done by squeezing a small amount of air into the ear. A tympanogram. This is a test that shows how well the eardrum moves in response to air pressure in the ear canal. It provides a graph for your health care provider to review. How is this treated? This condition can go away on its own within 3-5 days. But if the condition is caused by a bacterial infection and does not go away on its own, or if it keeps coming back, your health care provider may: Prescribe antibiotic medicine to treat the infection. Prescribe or recommend medicines to control pain. Follow  these instructions at home: Take over-the-counter and prescription medicines only as told by your health care provider. If you were prescribed an antibiotic medicine, take it as told by your health care provider. Do not stop taking the antibiotic even if you start to feel better. Keep all follow-up visits. This is important. Contact a health care provider if: You have bleeding from your nose. There is a lump on your neck. You are not feeling better in 5 days. You feel worse instead of better. Get help right away if: You have severe pain that is not controlled with medicine. You have swelling, redness, or pain around your ear. You have stiffness in your neck. A part of your face is not moving (paralyzed). The bone behind your ear (mastoid bone) is tender when you touch it. You develop a severe headache. Summary Otitis media is redness, soreness, and swelling of the middle ear, usually resulting in pain and decreased hearing.  This condition can go away on its own within 3-5 days. If the problem does not go away in 3-5 days, your health care provider may give you medicines to treat the infection. If you were prescribed an antibiotic medicine, take it as told by your health care provider. Follow all instructions that were given to you by your health care provider. This information is not intended to replace advice given to you by your health care provider. Make sure you discuss any questions you have with your health care provider. Document Revised: 04/18/2020 Document Reviewed: 04/18/2020 Elsevier Patient Education  [place]. Dizziness Dizziness is a common problem. It is a feeling of unsteadiness or light-headedness. You may feel like you are about to faint. Dizziness can lead to injury if you stumble or fall. Anyone can become dizzy, but dizziness is more common in older adults. This condition can be caused by a number of things, including medicines, dehydration, or  illness. Follow these instructions at home: Eating and drinking  Drink enough fluid to keep your urine pale yellow. This helps to keep you from becoming dehydrated. Try to drink more clear fluids, such as water. Do not drink alcohol. Limit your caffeine intake if told to do so by your health care provider. Check ingredients and nutrition facts to see if a food or beverage contains caffeine. Limit your salt (sodium) intake if told to do so by your health care provider. Check ingredients and nutrition facts to see if a food or beverage contains sodium. Activity  Avoid making quick movements. Rise slowly from chairs and steady yourself until you feel okay. In the morning, first sit up on the side of the bed. When you feel okay, stand slowly while you hold onto something until you know that your balance is good. If you need to stand in one place for a long time, move your legs often. Tighten and relax the muscles in your legs while you are standing. Do not drive or use machinery if you feel dizzy. Avoid bending down if you feel dizzy. Place items in your home so that they are easy for you to reach without leaning over. Lifestyle Do not use any products that contain nicotine or tobacco. These products include cigarettes, chewing tobacco, and vaping devices, such as e-cigarettes. If you need help quitting, ask your health care provider. Try to reduce your stress level by using methods such as yoga or meditation. Talk with your health care provider if you need help to manage your stress. General instructions Watch your dizziness for any changes. Take over-the-counter and prescription medicines only as told by your health care provider. Talk with your health care provider if you think that your dizziness is caused by a medicine that you are taking. Tell a friend or a family member that you are feeling dizzy. If he or she notices any changes in your behavior, have this person call your health care  provider. Keep all follow-up visits. This is important. Contact a health care provider if: Your dizziness does not go away or you have new symptoms. Your dizziness or light-headedness gets worse. You feel nauseous. You have reduced hearing. You have a fever. You have neck pain or a stiff neck. Your dizziness leads to an injury or a fall. Get help right away if: You vomit or have diarrhea and are unable to eat or drink anything. You have problems talking, walking, swallowing, or using your arms, hands, or legs. You feel generally weak. You have  any bleeding. You are not thinking clearly or you have trouble forming sentences. It may take a friend or family member to notice this. You have chest pain, abdominal pain, shortness of breath, or sweating. Your vision changes or you develop a severe headache. These symptoms may represent a serious problem that is an emergency. Do not wait to see if the symptoms will go away. Get medical help right away. Call your local emergency services (911 in the [place] Do not drive yourself to the hospital. Summary Dizziness is a feeling of unsteadiness or light-headedness. This condition can be caused by a number of things, including medicines, dehydration, or illness. Anyone can become dizzy, but dizziness is more common in older adults. Drink enough fluid to keep your urine pale yellow. Do not drink alcohol. Avoid making quick movements if you feel dizzy. Monitor your dizziness for any changes. This information is not intended to replace advice given to you by your health care provider. Make sure you discuss any questions you have with your health care provider. Document Revised: 12/14/2019 Document Reviewed: 12/14/2019 Elsevier Patient Education  [place].  Eustachian Tube Dysfunction  Eustachian tube dysfunction refers to a condition in which a blockage develops in the narrow passage that connects the middle ear to the back of the nose  (eustachian tube). The eustachian tube regulates air pressure in the middle ear by letting air move between the ear and nose. It also helps to drain fluid from the middle ear space. Eustachian tube dysfunction can affect one or both ears. When the eustachian tube does not function properly, air pressure, fluid, or both can build up in the middle ear. What are the causes? This condition occurs when the eustachian tube becomes blocked or cannot open normally. Common causes of this condition include: Ear infections. Colds and other infections that affect the nose, mouth, and throat (upper respiratory tract). Allergies. Irritation from cigarette smoke. Irritation from stomach acid coming up into the esophagus (gastroesophageal reflux). The esophagus is the part of the body that moves food from the mouth to the stomach. Sudden changes in air pressure, such as from descending in an airplane or scuba diving. Abnormal growths in the nose or throat, such as: Growths that line the nose (nasal polyps). Abnormal growth of cells (tumors). Enlarged tissue at the back of the throat (adenoids). What increases the risk? You are more likely to develop this condition if: You smoke. You are overweight. You are a child who has: Certain birth defects of the mouth, such as cleft palate. Large tonsils or adenoids. What are the signs or symptoms? Common symptoms of this condition include: A feeling of fullness in the ear. Ear pain. Clicking or popping noises in the ear. Ringing in the ear (tinnitus). Hearing loss. Loss of balance. Dizziness. Symptoms may get worse when the air pressure around you changes, such as when you travel to an area of high elevation, fly on an airplane, or go scuba diving. How is this diagnosed? This condition may be diagnosed based on: Your symptoms. A physical exam of your ears, nose, and throat. Tests, such as those that measure: The movement of your eardrum. Your hearing  (audiometry). How is this treated? Treatment depends on the cause and severity of your condition. In mild cases, you may relieve your symptoms by moving air into your ears. This is called "popping the ears." In more severe cases, or if you have symptoms of fluid in your ears, treatment may include: Medicines to  relieve congestion (decongestants). Medicines that treat allergies (antihistamines). Nasal sprays or ear drops that contain medicines that reduce swelling (steroids). A procedure to drain the fluid in your eardrum. In this procedure, a small tube may be placed in the eardrum to: Drain the fluid. Restore the air in the middle ear space. A procedure to insert a balloon device through the nose to inflate the opening of the eustachian tube (balloon dilation). Follow these instructions at home: Lifestyle Do not do any of the following until your health care provider approves: Travel to high altitudes. Fly in airplanes. Work in a Pension scheme manager or room. Scuba dive. Do not use any products that contain nicotine or tobacco. These products include cigarettes, chewing tobacco, and vaping devices, such as e-cigarettes. If you need help quitting, ask your health care provider. Keep your ears dry. Wear fitted earplugs during showering and bathing. Dry your ears completely after. General instructions Take over-the-counter and prescription medicines only as told by your health care provider. Use techniques to help pop your ears as recommended by your health care provider. These may include: Chewing gum. Yawning. Frequent, forceful swallowing. Closing your mouth, holding your nose closed, and gently blowing as if you are trying to blow air out of your nose. Keep all follow-up visits. This is important. Contact a health care provider if: Your symptoms do not go away after treatment. Your symptoms come back after treatment. You are unable to pop your ears. You have: A fever. Pain in your  ear. Pain in your head or neck. Fluid draining from your ear. Your hearing suddenly changes. You become very dizzy. You lose your balance. Get help right away if: You have a sudden, severe increase in any of your symptoms. Summary Eustachian tube dysfunction refers to a condition in which a blockage develops in the eustachian tube. It can be caused by ear infections, allergies, inhaled irritants, or abnormal growths in the nose or throat. Symptoms may include ear pain or fullness, hearing loss, or ringing in the ears. Mild cases are treated with techniques to unblock the ears, such as yawning or chewing gum. More severe cases are treated with medicines or procedures. This information is not intended to replace advice given to you by your health care provider. Make sure you discuss any questions you have with your health care provider. Document Revised: 03/21/2020 Document Reviewed: 03/21/2020 Elsevier Patient Education  [place]  DASH Eating Plan DASH stands for Dietary Approaches to Stop Hypertension. The DASH eating plan is a healthy eating plan that has been shown to: Reduce high blood pressure (hypertension). Reduce your risk for type 2 diabetes, heart disease, and stroke. Help with weight loss. What are tips for following this plan? Reading food labels Check food labels for the amount of salt (sodium) per serving. Choose foods with less than 5 percent of the Daily Value of sodium. Generally, foods with less than 300 milligrams (mg) of sodium per serving fit into this eating plan. To find whole grains, look for the word "whole" as the first word in the ingredient list. Shopping Buy products labeled as "low-sodium" or "no salt added." Buy fresh foods. Avoid canned foods and pre-made or frozen meals. Cooking Avoid adding salt when cooking. Use salt-free seasonings or herbs instead of table salt or sea salt. Check with your health care provider or pharmacist before using  salt substitutes. Do not fry foods. Cook foods using healthy methods such as baking, boiling, grilling, roasting, and broiling instead. Cook with  heart-healthy oils, such as olive, canola, avocado, soybean, or sunflower oil. Meal planning  Eat a balanced diet that includes: 4 or more servings of fruits and 4 or more servings of vegetables each day. Try to fill one-half of your plate with fruits and vegetables. 6-8 servings of whole grains each day. Less than 6 oz (170 g) of lean meat, poultry, or fish each day. A 3-oz (85-g) serving of meat is about the same size as a deck of cards. One egg equals 1 oz (28 g). 2-3 servings of low-fat dairy each day. One serving is 1 cup (237 mL). 1 serving of nuts, seeds, or beans 5 times each week. 2-3 servings of heart-healthy fats. Healthy fats called omega-3 fatty acids are found in foods such as walnuts, flaxseeds, fortified milks, and eggs. These fats are also found in cold-water fish, such as sardines, salmon, and mackerel. Limit how much you eat of: Canned or prepackaged foods. Food that is high in trans fat, such as some fried foods. Food that is high in saturated fat, such as fatty meat. Desserts and other sweets, sugary drinks, and other foods with added sugar. Full-fat dairy products. Do not salt foods before eating. Do not eat more than 4 egg yolks a week. Try to eat at least 2 vegetarian meals a week. Eat more home-cooked food and less restaurant, buffet, and fast food. Lifestyle When eating at a restaurant, ask that your food be prepared with less salt or no salt, if possible. If you drink alcohol: Limit how much you use to: 0-1 drink a day for women who are not pregnant. 0-2 drinks a day for men. Be aware of how much alcohol is in your drink. In the [place] one drink equals one 12 oz bottle of beer (355 mL), one 5 oz glass of wine (148 mL), or one 1 oz glass of hard liquor (44 mL). General information Avoid eating more than 2,300 mg of  salt a day. If you have hypertension, you may need to reduce your sodium intake to 1,500 mg a day. Work with your health care provider to maintain a healthy body weight or to lose weight. Ask what an ideal weight is for you. Get at least 30 minutes of exercise that causes your heart to beat faster (aerobic exercise) most days of the week. Activities may include walking, swimming, or biking. Work with your health care provider or dietitian to adjust your eating plan to your individual calorie needs. What foods should I eat? Fruits All fresh, dried, or frozen fruit. Canned fruit in natural juice (without added sugar). Vegetables Fresh or frozen vegetables (raw, steamed, roasted, or grilled). Low-sodium or reduced-sodium tomato and vegetable juice. Low-sodium or reduced-sodium tomato sauce and tomato paste. Low-sodium or reduced-sodium canned vegetables. Grains Whole-grain or whole-wheat bread. Whole-grain or whole-wheat pasta. Brown rice. [place]. Bulgur. Whole-grain and low-sodium cereals. Pita bread. Low-fat, low-sodium crackers. Whole-wheat flour tortillas. Meats and other proteins Skinless chicken or [place]. Ground chicken or [place]. Pork with fat trimmed off. Fish and seafood. Egg whites. Dried beans, peas, or lentils. Unsalted nuts, nut butters, and seeds. Unsalted canned beans. Lean cuts of beef with fat trimmed off. Low-sodium, lean precooked or cured meat, such as sausages or meat loaves. Dairy Low-fat (1%) or fat-free (skim) milk. Reduced-fat, low-fat, or fat-free cheeses. Nonfat, low-sodium ricotta or cottage cheese. Low-fat or nonfat yogurt. Low-fat, low-sodium cheese. Fats and oils Soft margarine without trans fats. Vegetable oil. Reduced-fat, low-fat, or light mayonnaise and salad dressings (  reduced-sodium). Canola, safflower, olive, avocado, soybean, and sunflower oils. Avocado. Seasonings and condiments Herbs. Spices. Seasoning mixes without salt. Other foods Unsalted  popcorn and pretzels. Fat-free sweets. The items listed above may not be a complete list of foods and beverages you can eat. Contact a dietitian for more information. What foods should I avoid? Fruits Canned fruit in a light or heavy syrup. Fried fruit. Fruit in cream or butter sauce. Vegetables Creamed or fried vegetables. Vegetables in a cheese sauce. Regular canned vegetables (not low-sodium or reduced-sodium). Regular canned tomato sauce and paste (not low-sodium or reduced-sodium). Regular tomato and vegetable juice (not low-sodium or reduced-sodium). Angie Fava. Olives. Grains Baked goods made with fat, such as croissants, muffins, or some breads. Dry pasta or rice meal packs. Meats and other proteins Fatty cuts of meat. Ribs. Fried meat. Berniece Salines. Bologna, salami, and other precooked or cured meats, such as sausages or meat loaves. Fat from the back of a pig (fatback). Bratwurst. Salted nuts and seeds. Canned beans with added salt. Canned or smoked fish. Whole eggs or egg yolks. Chicken or [place] with skin. Dairy Whole or 2% milk, cream, and half-and-half. Whole or full-fat cream cheese. Whole-fat or sweetened yogurt. Full-fat cheese. Nondairy creamers. Whipped toppings. Processed cheese and cheese spreads. Fats and oils Butter. Stick margarine. Lard. Shortening. Ghee. Bacon fat. Tropical oils, such as coconut, palm kernel, or palm oil. Seasonings and condiments Onion salt, garlic salt, seasoned salt, table salt, and sea salt. Worcestershire sauce. Tartar sauce. Barbecue sauce. Teriyaki sauce. Soy sauce, including reduced-sodium. Steak sauce. Canned and packaged gravies. Fish sauce. Oyster sauce. Cocktail sauce. Store-bought horseradish. Ketchup. Mustard. Meat flavorings and tenderizers. Bouillon cubes. Hot sauces. Pre-made or packaged marinades. Pre-made or packaged taco seasonings. Relishes. Regular salad dressings. Other foods Salted popcorn and pretzels. The items listed above may not be a  complete list of foods and beverages you should avoid. Contact a dietitian for more information. Where to find more information National Heart, Lung, and Blood Institute: https://wilson-eaton.com/ American Heart Association: www.heart.org Academy of Nutrition and Dietetics: www.eatright.[place]: www.kidney.org Summary The DASH eating plan is a healthy eating plan that has been shown to reduce high blood pressure (hypertension). It may also reduce your risk for type 2 diabetes, heart disease, and stroke. When on the DASH eating plan, aim to eat more fresh fruits and vegetables, whole grains, lean proteins, low-fat dairy, and heart-healthy fats. With the DASH eating plan, you should limit salt (sodium) intake to 2,300 mg a day. If you have hypertension, you may need to reduce your sodium intake to 1,500 mg a day. Work with your health care provider or dietitian to adjust your eating plan to your individual calorie needs. This information is not intended to replace advice given to you by your health care provider. Make sure you discuss any questions you have with your health care provider. Document Revised: 12/12/2018 Document Reviewed: 12/12/2018 Elsevier Patient Education  [place]. Preventing Hypertension Hypertension, also called high blood pressure, is when the force of blood pumping through the arteries is too strong. Arteries are blood vessels that carry blood from the heart throughout the body. Often, hypertension does not cause symptoms until blood pressure is very high. It is important to have your blood pressure checked regularly. Diet and lifestyle changes can help you prevent hypertension, and they may make you feel better overall and improve your quality of life. If you already have hypertension, you may control it with diet and lifestyle changes, as  well as with medicine. How can this condition affect me? Over time, hypertension can damage the arteries and  decrease blood flow to important parts of the body, including the brain, heart, and kidneys. By keeping your blood pressure in a healthy range, you can help prevent complications like heart attack, heart failure, stroke, kidney failure, and vascular dementia. What can increase my risk? An unhealthy diet and a lack of physical activity can make you more likely to develop high blood pressure. Some other risk factors include: Age. The risk increases with age. Having family members who have had high blood pressure. Having certain health conditions, such as thyroid problems. Being overweight or obese. Drinking too much alcohol or caffeine. Having too much fat, sugar, calories, or salt (sodium) in your diet. Smoking or using illegal drugs. Taking certain medicines, such as antidepressants, decongestants, birth control pills, and NSAIDs, such as ibuprofen. What actions can I take to prevent or manage this condition? Work with your health care provider to make a hypertension prevention plan that works for you. You may be referred for counseling on a healthy diet and physical activity. Follow your plan and keep all follow-up visits. Diet changes Maintain a healthy diet. This includes: Eating less salt (sodium). Ask your health care provider how much sodium is safe for you to have. The general recommendation is to have less than 1 tsp (2,300 mg) of sodium a day. Do not add salt to your food. Choose low-sodium options when grocery shopping and eating out. Limiting fats in your diet. You can do this by eating low-fat or fat-free dairy products and by eating less red meat. Eating more fruits, vegetables, and whole grains. Make a goal to eat: 1-2 cups of fresh fruits and vegetables each day. 3-4 servings of whole grains each day. Avoiding foods and beverages that have added sugars. Eating fish that contain healthy fats (omega-3 fatty acids), such as mackerel or salmon. If you need help putting together a  healthy eating plan, try the DASH diet. This diet is high in fruits, vegetables, and whole grains. It is low in sodium, red meat, and added sugars. DASH stands for Dietary Approaches to Stop Hypertension. Lifestyle changes  Lose weight if you are overweight. Losing just 3-5% of your body weight can help prevent or control hypertension. For example, if your present weight is 200 lb (91 kg), a loss of 3-5% of your weight means losing 6-10 lb (2.7-4.5 kg). Ask your health care provider to help you with a diet and exercise plan to safely lose weight. Get enough exercise. Do at least 150 minutes of moderate-intensity exercise each week. You could do this in short exercise sessions several times a day, or you could do longer exercise sessions a few times a week. For example, you could take a brisk 10-minute walk or bike ride, 3 times a day, for 5 days a week. Find ways to reduce stress, such as exercising, meditating, listening to music, or taking a yoga class. If you need help reducing stress, ask your health care provider. Do not use any products that contain nicotine or tobacco. These products include cigarettes, chewing tobacco, and vaping devices, such as e-cigarettes. Chemicals in tobacco and nicotine products raise your blood pressure each time you use them. If you need help quitting, ask your health care provider. Learn how to check your blood pressure at home. Make sure that you know your personal target blood pressure, as told by your health care provider. Try to sleep  7-9 hours per night. Alcohol use Do not drink alcohol if: Your health care provider tells you not to drink. You are pregnant, may be pregnant, or are planning to become pregnant. If you drink alcohol: Limit how much you have to: 0-1 drink a day for women. 0-2 drinks a day for men. Know how much alcohol is in your drink. In the [place] one drink equals one 12 oz bottle of beer (355 mL), one 5 oz glass of wine (148 mL), or one 1 oz  glass of hard liquor (44 mL). Medicines In addition to diet and lifestyle changes, your health care provider may recommend medicines to help lower your blood pressure. In general: You may need to try a few different medicines to find what works best for you. You may need to take more than one medicine. Take over-the-counter and prescription medicines only as told by your health care provider. Questions to ask your health care provider What is my blood pressure goal? How can I lower my risk for high blood pressure? How should I monitor my blood pressure at home? Where to find support Your health care provider can help you prevent hypertension and help you keep your blood pressure at a healthy level. Your local hospital or your community may also provide support services and prevention programs. The American Heart Association offers an online support network at supportnetwork.heart.org Where to find more information Learn more about hypertension from: [place], Lung, and [place]: https://wilson-eaton.com/ Centers for Disease Control and Prevention: http://www.wolf.info/ American Academy of Family Physicians: familydoctor.org Learn more about the DASH diet from: [place], Lung, and [place]: https://wilson-eaton.com/ Contact a health care provider if: You think you are having a reaction to medicines you have taken. You have recurrent headaches or feel dizzy. You have swelling in your ankles. You have trouble with your vision. Get help right away if: You have sudden, severe chest, back, or abdominal pain or discomfort. You have shortness of breath. You have a sudden, severe headache. These symptoms may be an emergency. Get help right away. Call 911. Do not wait to see if the symptoms will go away. Do not drive yourself to the hospital. Summary Hypertension often does not cause any symptoms until blood pressure is very high. It is important to get your blood pressure checked  regularly. Diet and lifestyle changes are important steps in preventing hypertension. By keeping your blood pressure in a healthy range, you may prevent complications like heart attack, heart failure, stroke, and kidney failure. Work with your health care provider to make a hypertension prevention plan that works for you. This information is not intended to replace advice given to you by your health care provider. Make sure you discuss any questions you have with your health care provider. Document Revised: 10/27/2020 Document Reviewed: 10/27/2020 Elsevier Patient Education  [place]. .

## 2021-11-06 ENCOUNTER — Telehealth: Payer: Self-pay | Admitting: Registered Nurse

## 2021-11-06 DIAGNOSIS — R053 Chronic cough: Secondary | ICD-10-CM

## 2021-11-06 NOTE — Telephone Encounter (Signed)
Spoke with patient in workcenter stated dizziness has resolved since starting antibiotic and pushing noncaffinated fluids.  Denied ear pain/discharge/fever/chills/n/v/d.  Respirations even and unlabored RA skin warm dry and pink.  A&Ox3 spoke full sentences without difficulty.  Patient to follow up with clinic staff prn new or worsening symptoms.  Patient verbalized understanding information/instructions, agreed with plan of care and had no further questions at this time.

## 2021-11-22 ENCOUNTER — Encounter: Payer: Self-pay | Admitting: Registered Nurse

## 2021-11-22 NOTE — Telephone Encounter (Signed)
Patient returned call "Not the cough. Just constant clearing of my throat.  I've had this, though, for couple years.  Have wondered if it was GERD maybe.  But all the other creepies are better, though blood pressure may be a bit high (ugh). My throat felt tight/swollen this weekend"  Discussed with patient this may have been due to post nasal drip irritation.  Acid reflux or allergic reaction could cause swelling of throat also.  Consider restarting omeprazole '20mg'$  DR po daily available formulary EHW replacements.  Patient going to hold at this time on omeprazole DR as cannot remember it helping to stop throat clearing.  Consider follow up with GI or ENT also.  Allergist may also be beneficial.  Not taking any of his allergy medications at his time.  Consider restart to see if impact on symptoms since weather change/HVAC heat running now.  Rainy season has started/leaves piling on ground/mold/fungal elements increasing.  Consider salt water gargles/soup broth noncreamy to help ease throat.  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  Respirations even and unlabored RA skin warm dry and pink gait sure and steady.  Patient verbalized understanding information/instructions and had no further questions at this time.

## 2022-01-01 ENCOUNTER — Encounter: Payer: Self-pay | Admitting: Registered Nurse

## 2022-01-01 ENCOUNTER — Telehealth: Payer: Self-pay | Admitting: Registered Nurse

## 2022-01-01 DIAGNOSIS — Z20828 Contact with and (suspected) exposure to other viral communicable diseases: Secondary | ICD-10-CM

## 2022-01-01 DIAGNOSIS — R12 Heartburn: Secondary | ICD-10-CM

## 2022-01-01 MED ORDER — OSELTAMIVIR PHOSPHATE 75 MG PO CAPS: 75.0000 mg | ORAL_CAPSULE | Freq: Two times a day (BID) | ORAL | 0 refills | Status: AC

## 2022-01-01 NOTE — Telephone Encounter (Signed)
Dependent daughter diagnosed with flu would like prophylaxis tamiflu generic electronic Rx '75mg'$  po BID x 5 days #10 RF0 sent to patient pharmacy of choice.  [facility] handout on influenza sent to my chart.

## 2022-01-13 ENCOUNTER — Encounter: Payer: Self-pay | Admitting: Registered Nurse

## 2022-01-13 MED ORDER — OMEPRAZOLE 20 MG PO CPDR: 20.0000 mg | DELAYED_RELEASE_CAPSULE | Freq: Every day | ORAL | 0 refills | Status: AC

## 2022-01-13 NOTE — Telephone Encounter (Signed)
Patient reported he would like to restart omeprazole '20mg'$  DR po daily for GERD.  Reiterated avoid peppermint oil as can worsen symptoms.  Stated never got the flu from daughter or spouse feeling well otherwise except for heartburn/throat clearing.  Dispensed 90 day supply from [facility] to patient today.  If some relief but worsening a couple hours after dosing discussed increase to '20mg'$  po BID and notify NP/clinic staff.  Magnesium level check in 6 months with [facility] labs.  Patient verbalized understanding information/instructions, agreed with plan of care and had no further questions at this time.

## 2022-01-23 ENCOUNTER — Telehealth: Payer: Self-pay | Admitting: Registered Nurse

## 2022-01-23 ENCOUNTER — Encounter: Payer: Self-pay | Admitting: Registered Nurse

## 2022-01-23 DIAGNOSIS — J029 Acute pharyngitis, unspecified: Secondary | ICD-10-CM

## 2022-01-23 DIAGNOSIS — R051 Acute cough: Secondary | ICD-10-CM

## 2022-01-23 MED ORDER — AMOXICILLIN 875 MG PO TABS
875.0000 mg | ORAL_TABLET | Freq: Two times a day (BID) | ORAL | 0 refills | Status: AC
Start: 2022-01-23 — End: 2022-02-02

## 2022-01-23 MED ORDER — PSEUDOEPH-BROMPHEN-DM 30-2-10 MG/5ML PO SYRP: 5.0000 mL | ORAL_SOLUTION | Freq: Four times a day (QID) | ORAL | 0 refills | Status: AC | PRN

## 2022-01-24 NOTE — Telephone Encounter (Signed)
Patient reported worsening cough over the past couple of days feeling post nasal drip irritating uvula and throat feels swollen up/narrowed.  Tried OTC cold medicine not really helping.  Oropharynx with papules erythema, bilateral allergic shiners, frequent throat clearing and nonproductive cough, spoke full sentences between coughing.  Tolerating po intake without difficulty no dysphagia/dysphasia.  Discussed hard palate with papules erythematous along with oropharynx will treat for strep throat as rapid testing or throat cultures not available at this clinic.  Patient seen in his workcenter today.  Discussed covid testing when he gets home tonight due to acute rhinitis.  Discussed acute rhinitis could be related to many viruses, cold temperatures or allergic rhinitis.  Recently treated with tamiflu as daughter had flu a  Spouse with nasal congestion/rhinitis also.  Discussed cold weather temperatures can also provoke rhintiis vasomotor.  Discussed consider sudafed '30mg'$  po q4-6h prn rhinitis, nasal saline 2 sprays each nostril q2h wa/prn and flonase nasal 4mg 1 spray each nostril BID.  Discussed may take tylenol '1000mg'$  po q6h prn sore throat.  Discussed cool fluids, 1 tablespoon honey q4h prn cough, hydrating with water.  Avoiding a lot of dairy intake as increases mucous production.  Salty soup broths/salt water gargles can also help soothe throat/decrease swelling of throat tissues.  Patient to notify NP if new or worsening symptoms e.g. n/v/f/c/diarrhea/rash/body aches.  Start amoxicillin '875mg'$  po BID x 10 days #20 RF0 dispensed from [facility] to patient today.  Patient given bottle nasal saline from clinic stock and cough drops also may use 1 q2h po prn cough.  May stop omeprazole.  Discussed recent URI made cough reflex more irritable and may take up to 4-6 weeks to calm down again.  Still having post nasal drip irritation of throat/oropharynx structures.  Patient respirations even and unlabored RA skin warm dry  and pink A&Ox3  Patient verbalized understanding information/instructions, agreed with plan of care and had no further questions at this time.

## 2022-01-25 ENCOUNTER — Encounter: Payer: Self-pay | Admitting: Registered Nurse

## 2022-01-25 ENCOUNTER — Telehealth: Payer: Self-pay | Admitting: Registered Nurse

## 2022-01-25 DIAGNOSIS — R1110 Vomiting, unspecified: Secondary | ICD-10-CM

## 2022-01-25 MED ORDER — PROMETHAZINE HCL 6.25 MG/5ML PO SYRP
6.2500 mg | ORAL_SOLUTION | Freq: Four times a day (QID) | ORAL | 0 refills | Status: AC | PRN
Start: 2022-01-25 — End: ?

## 2022-01-25 MED ORDER — ALBUTEROL SULFATE HFA 108 (90 BASE) MCG/ACT IN AERS
1.0000 | INHALATION_SPRAY | RESPIRATORY_TRACT | 0 refills | Status: AC | PRN
Start: 2022-01-25 — End: 2022-02-24

## 2022-01-25 NOTE — Telephone Encounter (Signed)
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  Not related to dosing of amoxicillin as tolerating in the morning without difficulty.  Took liquid spouse had at home from illness in 2023 (bromphed DMand promethazine liquid) prn.  His bromphed DM rx on order at [facility] (hasn't been able to fill).  Hasn't used albuterol inhaler because hasn't picked up from pharmacy yet either.  Discussed with patient I would resend Rx again today.  Patient A&Ox3 spoke full sentences without difficulty skin warm dry and pink gait sure and steady  No audible throat clearing/cough/nasal congestion.  Feeling a little better today.  Stated only gets about 2 hours sleep per night as cough waking him up.  Electronic Rx albuterol 141mg/act 1-2puffs po q4-6h prn protracted cough/wheezing/chest tightness #1 RF sent to his pharmacy of choice.  Patient verbalized understanding information/instructions, agreed with plan of care and had no further questions at this time.

## 2022-01-26 NOTE — Telephone Encounter (Signed)
See tcon dated 01/25/2022

## 2022-01-31 NOTE — Telephone Encounter (Signed)
Left message for patient 01/29/21 spouse stated he was on vacation that day not home sick  01/30/21 spouse stated patient sleeping better at least 3 hours without coughing using albuterol and bromphed DM.  Patient not available at his workcenter when I was checking for him.

## 2022-02-06 ENCOUNTER — Ambulatory Visit: Payer: Self-pay | Admitting: Registered Nurse

## 2022-02-06 ENCOUNTER — Encounter: Payer: Self-pay | Admitting: Registered Nurse

## 2022-02-06 VITALS — HR 76 | Resp 16

## 2022-02-06 DIAGNOSIS — R051 Acute cough: Secondary | ICD-10-CM

## 2022-02-06 NOTE — Telephone Encounter (Signed)
Patient seen in workcenter today BBS CTA sp02 stable and symptoms improving off albuterol inhaler and bromphed DM.

## 2022-02-06 NOTE — Progress Notes (Signed)
Subjective:    Patient ID: Richard Salazar, male    DOB: 1960-07-21, 62 y.o.   MRN: [id number]  62y/o caucasian married established male here for re-evaluation cough.  Stopped bromphed DM, albuterol inhaler, omeprazole and promethazine.  Feeling better but sometimes feels ratting/wet in lungs wants things checked out today.  Sleeping well.  Denied fever/chills/n/v/d/GERD/diarrhea/vomiting      Review of Systems  Constitutional:  Negative for chills, diaphoresis and fever.  HENT:  Negative for congestion, sinus pressure and sinus pain.   Eyes:  Negative for visual disturbance.  Respiratory:  Positive for cough. Negative for choking, chest tightness, shortness of breath, wheezing and stridor.   Gastrointestinal:  Negative for abdominal pain, diarrhea, nausea and vomiting.  Genitourinary:  Negative for difficulty urinating.  Musculoskeletal:  Negative for back pain, gait problem and neck pain.  Skin:  Negative for rash.  Neurological:  Negative for dizziness, tremors, syncope, weakness and headaches.  Psychiatric/Behavioral:  Negative for agitation, confusion and sleep disturbance.        Objective:   Physical Exam Vitals and nursing note reviewed.  Constitutional:      General: He is awake. He is not in acute distress.    Appearance: Normal appearance. He is well-developed, well-groomed and overweight. He is not ill-appearing, toxic-appearing or diaphoretic.  HENT:     Head: Normocephalic and atraumatic.     Jaw: There is normal jaw occlusion.     Salivary Glands: Right salivary gland is not diffusely enlarged. Left salivary gland is not diffusely enlarged.     Right Ear: Hearing and external ear normal.     Left Ear: Hearing and external ear normal.     Nose: Nose normal. No congestion or rhinorrhea.     Mouth/Throat:     Lips: Pink. No lesions.     Mouth: Mucous membranes are moist. No oral lesions or angioedema.     Dentition: No gum lesions.     Tongue: No lesions.      Pharynx: Oropharynx is clear.     Comments: Bilateral allergic shiners; Eyes:     General: Lids are normal. Vision grossly intact. Gaze aligned appropriately. Allergic shiner present. No scleral icterus.       Right eye: No discharge.        Left eye: No discharge.     Extraocular Movements: Extraocular movements intact.     Conjunctiva/sclera: Conjunctivae normal.     Pupils: Pupils are equal, round, and reactive to light.  Neck:     Trachea: Trachea and phonation normal. No tracheal deviation.  Cardiovascular:     Rate and Rhythm: Normal rate and regular rhythm.     Pulses: Normal pulses.          Radial pulses are 2+ on the right side and 2+ on the left side.     Heart sounds: Normal heart sounds, S1 normal and S2 normal.  Pulmonary:     Effort: Pulmonary effort is normal. No respiratory distress.     Breath sounds: Normal breath sounds and air entry. No stridor, decreased air movement or transmitted upper airway sounds. No decreased breath sounds, wheezing, rhonchi or rales.     Comments: Spoke full sentences without difficulty; nonproductiveo cough observed in workcenter Chest:     Chest wall: No tenderness.  Abdominal:     Palpations: Abdomen is soft.  Musculoskeletal:        General: Normal range of motion.     Right hand: Normal strength.  Normal capillary refill.     Left hand: Normal strength. Normal capillary refill.     Cervical back: Normal range of motion and neck supple. No swelling, edema, deformity, erythema, signs of trauma, lacerations, rigidity, torticollis or crepitus. No pain with movement. Normal range of motion.     Thoracic back: No swelling, edema, deformity, signs of trauma, lacerations or tenderness. Normal range of motion.  Lymphadenopathy:     Head:     Right side of head: No submandibular or preauricular adenopathy.     Left side of head: No submandibular or preauricular adenopathy.     Cervical: No cervical adenopathy.     Right cervical: No  superficial cervical adenopathy.    Left cervical: No superficial cervical adenopathy.  Skin:    General: Skin is warm and dry.     Capillary Refill: Capillary refill takes less than 2 seconds.     Coloration: Skin is not ashen, cyanotic, jaundiced, mottled, pale or sallow.     Findings: No abrasion, bruising, burn, erythema, signs of injury, laceration, petechiae, rash or wound.  Neurological:     General: No focal deficit present.     Mental Status: He is alert and oriented to person, place, and time. Mental status is at baseline.     GCS: GCS eye subscore is 4. GCS verbal subscore is 5. GCS motor subscore is 6.     Cranial Nerves: Cranial nerves 2-12 are intact. No cranial nerve deficit, dysarthria or facial asymmetry.     Sensory: Sensation is intact.     Motor: Motor function is intact. No weakness, tremor, atrophy, abnormal muscle tone or seizure activity.     Coordination: Coordination is intact. Coordination normal.     Gait: Gait is intact. Gait normal.     Comments: In/out of chair without difficulty; gait sure and steady; bilateral hand grasp equal 5/5  Psychiatric:        Attention and Perception: Attention and perception normal.        Mood and Affect: Mood and affect normal.        Speech: Speech normal.        Behavior: Behavior normal. Behavior is cooperative.        Thought Content: Thought content normal.        Cognition and Memory: Cognition and memory normal.        Judgment: Judgment normal.           Assessment & Plan:  A-acute cough  P-still resolving from viral URI improved off all meds.  Hydrate.  Cover nose/mouth when outside in below freezing temperatures. Discussed cough reflex will be hyperactive for the month after viral illness.  If new or worsening symptoms seek re-evaluation.  Discussed sp02 normal/BBS CTA today.  May still use bromphed DM QID prn, albuterol inhaler 2 puffs po q4-6 prn protracted cough/chest tightness/shortness of breath or  wheezing, cough lozenges po prn available in clinic or honey 1 tablespoon every 4 hours prn cough.  Patient verbalized understanding information/instructions, agreed with plan of care and had no further questions at this time.

## 2022-03-13 ENCOUNTER — Encounter: Payer: Self-pay | Admitting: Registered Nurse

## 2022-03-13 ENCOUNTER — Ambulatory Visit: Payer: Self-pay | Admitting: Registered Nurse

## 2022-03-13 VITALS — BP 148/90 | HR 77

## 2022-03-13 DIAGNOSIS — B070 Plantar wart: Secondary | ICD-10-CM

## 2022-03-13 DIAGNOSIS — R251 Tremor, unspecified: Secondary | ICD-10-CM

## 2022-03-13 DIAGNOSIS — R030 Elevated blood-pressure reading, without diagnosis of hypertension: Secondary | ICD-10-CM

## 2022-03-13 DIAGNOSIS — Z0000 Encounter for general adult medical examination without abnormal findings: Secondary | ICD-10-CM

## 2022-03-13 MED ORDER — SALICYLIC ACID 6 % EX GEL: Freq: Every day | CUTANEOUS | 0 refills | Status: AC

## 2022-03-13 NOTE — Progress Notes (Signed)
Subjective:    Patient ID: Richard Salazar, male    DOB: 1960-10-23, 62 y.o.   MRN: [id number]  61y/o married caucasian male established patient here for bilateral foot pain. Worsening over the past week.  Denied known injury.  Thought in December maybe stepped on piece of broken ornament but he and spouse couldn't find any shards in foot/attempted removal.  Denied fever/chills/bleeding/bruising/discharge.  Right heel and left mid foot affected pain with weight bearing with and without shoes.  Denied new gym/recent travel/pool use.  Slept poorly last night "got a few hours"  Has had increased stressors at work this month.  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  Noticed finger tremor today in clinic while getting vital signs stated new.      Review of Systems  Constitutional:  Negative for activity change, appetite change, chills, diaphoresis, fatigue and fever.  HENT:  Negative for congestion, trouble swallowing and voice change.   Eyes:  Negative for photophobia and visual disturbance.  Respiratory:  Negative for cough, shortness of breath, wheezing and stridor.   Cardiovascular:  Negative for chest pain and palpitations.  Gastrointestinal:  Negative for diarrhea, nausea and vomiting.  Genitourinary:  Negative for difficulty urinating.  Musculoskeletal:  Positive for gait problem and myalgias. Negative for back pain, joint swelling, neck pain and neck stiffness.  Skin:  Negative for color change, pallor, rash and wound.  Neurological:  Positive for tremors. Negative for dizziness and headaches.  Psychiatric/Behavioral:  Negative for agitation, confusion and sleep disturbance.        Objective:   Physical Exam Vitals and nursing note reviewed.  Constitutional:      General: He is awake. He is not in acute distress.    Appearance: Normal appearance. He is well-developed, well-groomed and overweight. He is not ill-appearing, toxic-appearing or diaphoretic.  HENT:     Head: Normocephalic and atraumatic.      Jaw: There is normal jaw occlusion.     Salivary Glands: Right salivary gland is not diffusely enlarged. Left salivary gland is not diffusely enlarged.     Right Ear: Hearing and external ear normal.     Left Ear: Hearing and external ear normal.     Nose: Nose normal. No congestion or rhinorrhea.     Mouth/Throat:     Lips: Pink. No lesions.     Mouth: Mucous membranes are moist.     Tongue: No lesions. Tongue does not deviate from midline.     Palate: No mass and lesions.     Pharynx: Oropharynx is clear. Uvula midline.  Eyes:     General: Lids are normal. Vision grossly intact. Gaze aligned appropriately. Allergic shiner present. No scleral icterus.       Right eye: No discharge.        Left eye: No discharge.     Extraocular Movements: Extraocular movements intact.     Right eye: Normal extraocular motion and no nystagmus.     Left eye: Normal extraocular motion and no nystagmus.     Conjunctiva/sclera: Conjunctivae normal.     Right eye: Right conjunctiva is not injected. No chemosis, exudate or hemorrhage.    Left eye: Left conjunctiva is not injected. No chemosis, exudate or hemorrhage.    Pupils: Pupils are equal, round, and reactive to light.  Neck:     Trachea: Trachea and phonation normal. No tracheal deviation.  Cardiovascular:     Rate and Rhythm: Normal rate and regular rhythm.     Pulses:  Radial pulses are 2+ on the right side and 2+ on the left side.     Heart sounds: Normal heart sounds, S1 normal and S2 normal.  Pulmonary:     Effort: Pulmonary effort is normal.     Breath sounds: Normal breath sounds and air entry. No stridor or transmitted upper airway sounds. No wheezing.     Comments: Spoke full sentences without difficulty; no cough observed in exam room Abdominal:     General: Abdomen is flat.  Musculoskeletal:        General: Normal range of motion.     Cervical back: Normal range of motion and neck supple. No edema, erythema, signs of  trauma, rigidity, torticollis or crepitus. No pain with movement. Normal range of motion.     Right foot: No deformity.     Left foot: No deformity.  Feet:     Right foot:     Skin integrity: Callus present. No ulcer, blister, skin breakdown, erythema or warmth.     Toenail Condition: Right toenails are normal.     Left foot:     Skin integrity: Callus present. No ulcer, blister, skin breakdown, erythema or warmth.     Toenail Condition: Left toenails are normal.  Lymphadenopathy:     Head:     Right side of head: No submandibular or preauricular adenopathy.     Left side of head: No submandibular or preauricular adenopathy.     Cervical:     Right cervical: No superficial cervical adenopathy.    Left cervical: No superficial cervical adenopathy.  Skin:    General: Skin is warm and dry.     Capillary Refill: Capillary refill takes less than 2 seconds.     Coloration: Skin is not ashen, cyanotic, jaundiced, mottled, pale or sallow.     Findings: Lesion and rash present. No abrasion, abscess, acne, bruising, burn, ecchymosis, erythema, signs of injury, laceration, petechiae or wound. Rash is scaling.     Nails: There is no clubbing.     Comments: Fine scale and thick callous heels/overlying MTP joints bilaterally dry warm pink; punctate left 1st MTP with increased callous nummular 84m diameter; right calcalneous punctate with 428mdiameter nummular callous  Neurological:     General: No focal deficit present.     Mental Status: He is alert and oriented to person, place, and time. Mental status is at baseline.     GCS: GCS eye subscore is 4. GCS verbal subscore is 5. GCS motor subscore is 6.     Cranial Nerves: Cranial nerves 2-12 are intact. No cranial nerve deficit, dysarthria or facial asymmetry.     Motor: Tremor present. No weakness, atrophy, abnormal muscle tone or seizure activity.     Coordination: Coordination is intact. Coordination normal.     Gait: Gait is intact. Gait normal.      Comments: In/out of chair without difficulty; gait sure and steady in clinic; bilateral hand grasp equal 5/5; left hand tremor with holding out for sp02 monitoring not noticeable with writing  Psychiatric:        Attention and Perception: Attention and perception normal.        Mood and Affect: Mood and affect normal.        Speech: Speech normal.        Behavior: Behavior normal. Behavior is cooperative.        Thought Content: Thought content normal.        Cognition and Memory: Cognition and memory  normal.        Judgment: Judgment normal.      Verbal consent obtained.  Verified no iodine or rubbing alcohol allergy.  Iodine tincture applied bilateral feet and wiped with alcohol wipe prior to shaving with #10 blade.  Procedure stopped when patient reported too tender.  No bleeding/discharge noted where callous removed.     Assessment & Plan:   A-plantar warts bilateral initial visit; elevated blood pressure reading in office without hypertension diagnosis, tremor of left hand  P--Treated with shaving callous with #10 blade until slight tenderness no bleeding.  May buy salicytic acid/compound W OTC and apply daily this week.  Patient reported minimal pain after procedure.  Stated less pain with weight bearing after procedure. Discussed with patient to keep clean and dry and follow up in 7 days for reshave.  Cryotherapy not available in [facility] clinic.  PCM may have available.  Discussed with patient 2-3  treatments may be required to eradicate wart. Exitcare handout on plantar warts. Patient notified to expect scab at treated area but if erythema, purulent drainage, red streak radiating up hand/foot to return to clinic for reevaluation. Patient agreed with plan of care, verbalized understanding of instructions/information and had no further questions at this time.   Poor sleep, increased work stressors will monitor BP at next follow up appt.  1/2 cup coffee this am.  Avoid large intake of  caffeine this afternoon finish 1/2 cup coffee.  Discussed ER if chest pain, worst headache of life, dyspnea or visual changes for re-evaluation.  Patient verbalized understanding information/instructions, agreed with plan of care and had no further questions at this time.   Poor sleep increased stressors.  Hydrate try to budget 6-7 hours sleep per night.  Had caffeine this am.  Avoid skipping meals.  Will continue to monitor if recurrent left or bilateral hand tremors at follow up visits.  2025 Be Well labs due May will get sooner if new/worsening symptoms.  Consider neurology consult if sleep/less caffeine do not improve symptoms and normal labs.  Exitcare handout on tremor given.

## 2022-03-13 NOTE — Patient Instructions (Addendum)
Tremor A tremor is trembling or shaking that you cannot control. Most tremors affect the hands or arms. Tremors can also affect the head, vocal cords, face, and other parts of the body. There are many types of tremors. Common types include: Essential tremor. These usually occur in people older than 40. This type of tremor may run in families and can happen in otherwise healthy people. Resting tremor. These occur when the muscles are at rest, such as when your hands are resting in your lap. People with Parkinson's disease often have resting tremors. Postural tremor. These occur when you try to hold a pose, such as keeping your hands outstretched. Kinetic tremor. These occur during purposeful movement, such as trying to touch a finger to your nose. Task-specific tremor. These may occur when you do certain tasks such as writing, speaking, or standing. Psychogenic tremor. These are greatly reduced or go away when you are distracted. These tremors happen due to underlying stress or psychiatric disease. They can happen in people of all ages. Some types of tremors have no known cause. Tremors can also be a symptom of nervous system problems (neurological disorders) that may occur with aging. Some tremors go away with treatment, while others do not. Follow these instructions at home: Lifestyle     If you drink alcohol: Limit how much you have to: 0-1 drink a day for women who are not pregnant. 0-2 drinks a day for men. Know how much alcohol is in a drink. In the [place] one drink equals one 12 oz bottle of beer (355 mL), one 5 oz glass of wine (148 mL), or one 1 oz glass of hard liquor (44 mL). Do not use any products that contain nicotine or tobacco. These products include cigarettes, chewing tobacco, and vaping devices, such as e-cigarettes. If you need help quitting, ask your health care provider. Avoid extreme heat and extreme cold. Limit your caffeine intake, as told by your health care  provider. Try to get 8 hours of sleep each night. Find ways to manage your stress, such as meditation or yoga. General instructions Take over-the-counter and prescription medicines only as told by your health care provider. Keep all follow-up visits. This is important. Contact a health care provider if: You develop a tremor after starting a new medicine. You have a tremor along with other symptoms such as: Numbness. Tingling. Pain. Weakness. Your tremor gets worse. Your tremor interferes with your day-to-day life. Summary A tremor is trembling or shaking that you cannot control. Most tremors affect the hands or arms. Some types of tremors have no known cause. Others may be a symptom of nervous system problems (neurological disorders). Make sure you discuss any tremors you have with your health care provider. This information is not intended to replace advice given to you by your health care provider. Make sure you discuss any questions you have with your health care provider. Document Revised: 10/28/2020 Document Reviewed: 10/28/2020 Elsevier Patient Education  [place]  Warts are small growths on the skin. They are common and can occur on many areas of the body. A person may have one wart or several warts. In many cases, warts do not need treatment. They usually go away on their own over a period of many months to a few years. If needed, warts that cause problems or do not go away on their own can be treated. What are the causes? Warts are caused by a type of virus called human papillomavirus (HPV). HPV  can spread from person to person through direct contact. Warts can also spread to other areas of the body when a person scratches a wart and then scratches another area of his or her body. What increases the risk? You are more likely to develop this condition if: You are 91-35 years old. You have a weakened body defense system (immune system). You are  Caucasian. What are the signs or symptoms? The main symptom of this condition is small growths on the skin. Warts may: Be round or oval or have an irregular shape. Have a rough surface. Range in color from skin color to light yellow, brown, or gray. Generally be less than  inch (1.3 cm) in size. Go away and then come back again. Most warts are painless, but some can be painful if they are large or occur in an area of the body where pressure is applied to them, such as the bottom of the foot. How is this diagnosed? A wart can usually be diagnosed based on its appearance. In some cases, a tissue sample may be removed (biopsy) to be looked at under a microscope. How is this treated? In many cases, warts do not need treatment. Sometimes treatment is wanted. If treatment is needed or wanted, options may include: Applying medicated solutions, creams, or patches to the wart. These may be over-the-counter or prescription medicines that make the skin soft so that layers will gradually shed away. In many cases, the medicine is applied one or two times per day and covered with a bandage. Putting duct tape over the top of the wart (occlusion). You will leave the tape in place for as long as told by your health care provider and then replace it with a new strip of tape. This is done until the wart goes away. Freezing the wart with liquid nitrogen (cryotherapy). Burning the wart with: Laser treatment. An electrified probe (electrocautery). Injection of a medicine into the wart to help the body's immune system fight off the wart. Surgery to remove the wart. Follow these instructions at home: Medicines Use over-the-counter and prescription medicines only as told by your health care provider. Do not use over-the-counter wart medicines on your face or genitals unless your health care provider tells you to do that. Lifestyle Keep your immune system healthy. To do this: Eat a healthy, balanced diet. Get  enough sleep. Do not use any products that contain nicotine or tobacco. These products include cigarettes, chewing tobacco, and vaping devices, such as e-cigarettes. If you need help quitting, ask your health care provider. General instructions  Wash your hands after you touch a wart. Do not scratch or pick at a wart. Avoid shaving hair that is over a wart. Keep all follow-up visits. This is important. Contact a health care provider if: Your warts do not improve after treatment. You have redness, swelling, or pain at the site of a wart. You have bleeding from a wart that does not stop with light pressure. You have diabetes and you develop a wart. Summary Warts are small growths on the skin. They are common and can occur on many areas of the body. In many cases, warts do not need treatment. Sometimes treatment is wanted. If treatment is needed or wanted, there are several treatment options. Apply over-the-counter and prescription medicines only as told by your health care provider. Wash your hands after you touch a wart. Keep all follow-up visits. This is important. This information is not intended to replace advice given to you  by your health care provider. Make sure you discuss any questions you have with your health care provider. Document Revised: 02/10/2021 Document Reviewed: 02/10/2021 Elsevier Patient Education  [place].

## 2022-03-22 ENCOUNTER — Other Ambulatory Visit: Payer: Self-pay | Admitting: Occupational Medicine

## 2022-04-05 ENCOUNTER — Ambulatory Visit: Payer: Self-pay | Admitting: Registered Nurse

## 2022-04-05 ENCOUNTER — Encounter: Payer: Self-pay | Admitting: Registered Nurse

## 2022-04-05 DIAGNOSIS — B070 Plantar wart: Secondary | ICD-10-CM

## 2022-04-05 NOTE — Progress Notes (Signed)
Subjective:    Patient ID: Richard Salazar, male    DOB: 02/20/1960, 62 y.o.   MRN: [id number]  61y/o married established caucasian male last seen 03/13/22 for bilateral plantar warts shaved at that time and instructed to start wart treatment OTC and follow up for reshave prn.  Patient reported he bought [facility] wart pads and has been applying per instructions new pad every other day.  Sometimes falling off right heel and left midfoot and he doesn't notice until shower.  Feels like pebble in shoe still when walking/pad itself making walking awkward also sometime uncomfortable pressure.  Discussed with patient to use the circle pad with center removed also to help avoid pressure over wart and see if that helps also.  Denied rash/fever/chills/bleeding/discharge/swelling.      Review of Systems Constitutional:  Negative for activity change, appetite change, chills, diaphoresis, fatigue and fever.  HENT:  Negative for congestion, trouble swallowing and voice change.   Eyes:  Negative for photophobia and visual disturbance.  Respiratory:  Negative for cough, shortness of breath, wheezing and stridor.   Cardiovascular:  Negative for chest pain and palpitations.  Gastrointestinal:  Negative for diarrhea, nausea and vomiting.  Genitourinary:  Negative for difficulty urinating.  Musculoskeletal:  Positive for gait problem and myalgias. Negative for back pain, joint swelling, neck pain and neck stiffness.  Skin:  Negative for color change, pallor, rash and wound.  Neurological:  Negative for dizziness, headaches and tremors.Marland Kitchen  Psychiatric/Behavioral:  Negative for agitation, confusion and sleep disturbance.      Objective:   Physical Exam Constitutional:      General: He is awake. He is not in acute distress.    Appearance: Normal appearance. He is well-developed, well-groomed and overweight. He is not ill-appearing, toxic-appearing or diaphoretic.  HENT:     Head: Normocephalic and atraumatic.      Jaw: There is normal jaw occlusion.     Salivary Glands: Right salivary gland is not diffusely enlarged. Left salivary gland is not diffusely enlarged.     Right Ear: Hearing and external ear normal.     Left Ear: Hearing and external ear normal.     Nose: Nose normal. No congestion or rhinorrhea.     Mouth/Throat:     Lips: Pink. No lesions.     Mouth: Mucous membranes are moist.     Tongue: No lesions. Tongue does not deviate from midline.     Palate: No mass and lesions.     Pharynx: Oropharynx is clear. Uvula midline.  Eyes:     General: Lids are normal. Vision grossly intact. Gaze aligned appropriately. Allergic shiner present. No scleral icterus.       Right eye: No discharge.        Left eye: No discharge.     Extraocular Movements: Extraocular movements intact.     Right eye: Normal extraocular motion and no nystagmus.     Left eye: Normal extraocular motion and no nystagmus.     Conjunctiva/sclera: Conjunctivae normal.     Right eye: Right conjunctiva is not injected. No chemosis, exudate or hemorrhage.    Left eye: Left conjunctiva is not injected. No chemosis, exudate or hemorrhage.    Pupils: Pupils are equal, round, and reactive to light.  Neck:     Trachea: Trachea and phonation normal. No tracheal deviation.  Cardiovascular:     Rate and Rhythm: Normal rate and regular rhythm.     Pulses:  Radial pulses are 2+ on the right side and 2+ on the left side.     Heart sounds: Normal heart sounds, S1 normal and S2 normal.  Pulmonary:     Effort: Pulmonary effort is normal.     Breath sounds: Normal breath sounds and air entry. No stridor or transmitted upper airway sounds. No wheezing.     Comments: Spoke full sentences without difficulty; no cough observed in exam room Abdominal:     General: Abdomen is flat.  Musculoskeletal:        General: Normal range of motion.     Cervical back: Normal range of motion and neck supple. No edema, erythema, signs of trauma,  rigidity, torticollis or crepitus. No pain with movement. Normal range of motion.     Right foot: No deformity.     Left foot: No deformity.  Feet:     Right foot:     Skin integrity: Callus present. No ulcer, blister, skin breakdown, erythema or warmth.     Toenail Condition: Right toenails are normal.     Left foot:     Skin integrity: Callus present. No ulcer, blister, skin breakdown, erythema or warmth.     Toenail Condition: Left toenails are normal.  Lymphadenopathy:     Head:     Right side of head: No submandibular or preauricular adenopathy.     Left side of head: No submandibular or preauricular adenopathy.     Cervical:     Right cervical: No superficial cervical adenopathy.    Left cervical: No superficial cervical adenopathy.  Skin:    General: Skin is warm and dry.     Capillary Refill: Capillary refill takes less than 2 seconds.     Coloration: Skin is not ashen, cyanotic, jaundiced, mottled, pale or sallow.     Findings: Lesion and rash present. No abrasion, abscess, acne, bruising, burn, ecchymosis, erythema, signs of injury, laceration, petechiae or wound. Rash is scaling.     Nails: There is no clubbing.     Comments: Fine scale and thick callous heels/overlying MTP joints bilaterally dry warm pink; punctate left 2nd MTP with increased callous nummular 40m diameter; right calcalneous punctate with 756mdiameter nummular callous  Neurological:     General: No focal deficit present.     Mental Status: He is alert and oriented to person, place, and time. Mental status is at baseline.     GCS: GCS eye subscore is 4. GCS verbal subscore is 5. GCS motor subscore is 6.     Cranial Nerves: Cranial nerves 2-12 are intact. No cranial nerve deficit, dysarthria or facial asymmetry.     Motor: Tremor present. No weakness, atrophy, abnormal muscle tone or seizure activity.     Coordination: Coordination is intact. Coordination normal.     Gait: Gait is intact. Gait normal.      Comments: In/out of chair without difficulty; gait sure and steady in clinic; bilateral hand grasp equal 5/5; left hand tremor with holding out for sp02 monitoring not noticeable with writing  Psychiatric:        Attention and Perception: Attention and perception normal.        Mood and Affect: Mood and affect normal.        Speech: Speech normal.        Behavior: Behavior normal. Behavior is cooperative.        Thought Content: Thought content normal.        Cognition and Memory: Cognition and memory  normal.        Judgment: Judgment normal.          Verbal consent obtained.  Verified no iodine or rubbing alcohol allergy.  Iodine tincture applied bilateral feet and wiped with alcohol wipe prior to shaving with #10 blade.  Procedure stopped when patient reported too tender.  No bleeding/discharge noted where callous removed. Discussed patient to reapply wart patch tonight at bed time.             Assessment & Plan:   A-plantar warts bilateral subsequent visit   P--Treated with shaving callous with #10 blade until slight tenderness no bleeding.  Continue [facility] wart pads they are working change every other day per [employer].  Patient reported minimal pain after procedure.  Stated less pain with weight bearing after procedure. Discussed with patient to keep clean and dry and follow up in 10-14 days for reshave.  Cryotherapy not available in [facility] clinic.  PCM may have available.  Discussed with patient 2-3  treatments may be required to eradicate wart. Exitcare handout on plantar warts. Patient notified to expect scab at treated area but if erythema, purulent drainage, red streak radiating up hand/foot to return to clinic for reevaluation. Patient agreed with plan of care, verbalized understanding of instructions/information and had no further questions at this time.

## 2022-05-17 ENCOUNTER — Other Ambulatory Visit: Payer: Self-pay | Admitting: Occupational Medicine

## 2022-05-17 DIAGNOSIS — Z0000 Encounter for general adult medical examination without abnormal findings: Secondary | ICD-10-CM

## 2022-05-17 NOTE — Progress Notes (Signed)
Lab drawn tolerated well no issues noted.   

## 2022-05-18 LAB — CMP12+LP+TP+TSH+6AC+CBC/D/PLT
ALT: 16 IU/L (ref 0–44)
AST: 20 IU/L (ref 0–40)
Albumin/Globulin Ratio: 1.8 (ref 1.2–2.2)
Albumin: 4.7 g/dL (ref 3.9–4.9)
Alkaline Phosphatase: 86 IU/L (ref 44–121)
BUN/Creatinine Ratio: 12 (ref 10–24)
BUN: 13 mg/dL (ref 8–27)
Basophils Absolute: 0 10*3/uL (ref 0.0–0.2)
Basos: 0 %
Bilirubin Total: 0.4 mg/dL (ref 0.0–1.2)
Calcium: 9.7 mg/dL (ref 8.6–10.2)
Chloride: 103 mmol/L (ref 96–106)
Chol/HDL Ratio: 3.5 ratio (ref 0.0–5.0)
Cholesterol, Total: 163 mg/dL (ref 100–199)
Creatinine, Ser: 1.13 mg/dL (ref 0.76–1.27)
EOS (ABSOLUTE): 0 10*3/uL (ref 0.0–0.4)
Eos: 1 %
Estimated CHD Risk: 0.6 times avg. (ref 0.0–1.0)
Free Thyroxine Index: 1.7 (ref 1.2–4.9)
GGT: 13 IU/L (ref 0–65)
Globulin, Total: 2.6 g/dL (ref 1.5–4.5)
Glucose: 103 mg/dL — ABNORMAL HIGH (ref 70–99)
HDL: 46 mg/dL (ref >39)
Hematocrit: 46.9 % (ref 37.5–51.0)
Hemoglobin: 16.1 g/dL (ref 13.0–17.7)
Immature Grans (Abs): 0 10*3/uL (ref 0.0–0.1)
Immature Granulocytes: 0 %
Iron: 87 ug/dL (ref 38–169)
LDH: 139 IU/L (ref 121–224)
LDL Chol Calc (NIH): 100 mg/dL — ABNORMAL HIGH (ref 0–99)
Lymphocytes Absolute: 2 10*3/uL (ref 0.7–3.1)
Lymphs: 38 %
MCH: 33.3 pg — ABNORMAL HIGH (ref 26.6–33.0)
MCHC: 34.3 g/dL (ref 31.5–35.7)
MCV: 97 fL (ref 79–97)
Monocytes Absolute: 0.6 10*3/uL (ref 0.1–0.9)
Monocytes: 11 %
Neutrophils Absolute: 2.6 10*3/uL (ref 1.4–7.0)
Neutrophils: 50 %
Phosphorus: 2.9 mg/dL (ref 2.8–4.1)
Platelets: 210 10*3/uL (ref 150–450)
Potassium: 4.6 mmol/L (ref 3.5–5.2)
RBC: 4.83 x10E6/uL (ref 4.14–5.80)
RDW: 12.3 % (ref 11.6–15.4)
Sodium: 141 mmol/L (ref 134–144)
T3 Uptake Ratio: 29 % (ref 24–39)
T4, Total: 5.8 ug/dL (ref 4.5–12.0)
TSH: 2.1 u[IU]/mL (ref 0.450–4.500)
Total Protein: 7.3 g/dL (ref 6.0–8.5)
Triglycerides: 92 mg/dL (ref 0–149)
Uric Acid: 6.4 mg/dL (ref 3.8–8.4)
VLDL Cholesterol Cal: 17 mg/dL (ref 5–40)
WBC: 5.2 10*3/uL (ref 3.4–10.8)
eGFR: 73 mL/min/{1.73_m2} (ref >59)

## 2022-05-18 LAB — HEMOGLOBIN A1C
Est. average glucose Bld gHb Est-mCnc: 128 mg/dL
Hgb A1c MFr Bld: 6.1 % — ABNORMAL HIGH (ref 4.8–5.6)

## 2022-05-19 ENCOUNTER — Encounter: Payer: Self-pay | Admitting: Registered Nurse

## 2022-05-19 ENCOUNTER — Other Ambulatory Visit: Payer: Self-pay | Admitting: Registered Nurse

## 2022-05-19 DIAGNOSIS — R7303 Prediabetes: Secondary | ICD-10-CM

## 2022-05-30 ENCOUNTER — Ambulatory Visit: Payer: Self-pay | Admitting: Occupational Medicine

## 2022-05-30 VITALS — BP 138/80 | HR 80 | Ht 68.5 in | Wt 204.4 lb

## 2022-05-30 DIAGNOSIS — R7303 Prediabetes: Secondary | ICD-10-CM

## 2022-05-30 DIAGNOSIS — Z0000 Encounter for general adult medical examination without abnormal findings: Secondary | ICD-10-CM

## 2022-05-30 NOTE — Progress Notes (Signed)
Be well insurance premium discount evaluation: Met  Epic reviewed by RN [name] and transcribed and reviewed labs. Scheduled follow up labs. Emailed dietitian link. Tobacco attestation signed. [facility] ROI formed signed. Forms placed in the chart.   Patient given handouts for [facility] pharmacies and discount drugs list,MyChart, Tele doc setup, Tele doc [address], [place] counseling and [employer] counseling.  What to do for infectious illness protocol. Given handout for list of medications that can be filled at [facility]. Given Clinic hours and Clinic Email.

## 2022-06-14 ENCOUNTER — Encounter: Payer: Self-pay | Admitting: Registered Nurse

## 2022-06-14 ENCOUNTER — Telehealth: Payer: Self-pay | Admitting: Registered Nurse

## 2022-06-14 DIAGNOSIS — B070 Plantar wart: Secondary | ICD-10-CM

## 2022-06-14 NOTE — Telephone Encounter (Signed)
Spouse reported has been shaving warts for patient, applying compound W and did freeze OTC treatments but still having pain with weight bearing especially if floor uneven e.g. transition strip.  Still has warts present on foot also.  Discussed to schedule appt with me next Tuesday 5/28 9-12 for re-evaluation.  Discussed may trial duct tape apply piece to cover entire affected area and leave on until falls off or I remove next week.  Apply over weekend to see if any better results for patient also.  Consider podiatry referral.  Spouse verbalized understanding information/instructions and had no further questions at this time.

## 2022-07-06 ENCOUNTER — Encounter: Payer: Self-pay | Admitting: Registered Nurse

## 2022-07-12 ENCOUNTER — Ambulatory Visit: Payer: Self-pay | Admitting: Registered Nurse

## 2022-07-12 ENCOUNTER — Encounter: Payer: Self-pay | Admitting: Registered Nurse

## 2022-07-12 VITALS — BP 125/72 | HR 77 | Temp 98.2°F | Resp 18

## 2022-07-12 DIAGNOSIS — B070 Plantar wart: Secondary | ICD-10-CM

## 2022-07-12 NOTE — Patient Instructions (Addendum)
Richard Ohm, Do home freezing treatment on both warts tonight after work and see me the week of 8 Jul in clinic for re-eval  Warts  Warts are small growths on the skin. They are common and can occur on many areas of the body. A person may have one wart or several warts. In many cases, warts do not need treatment. They usually go away on their own over a period of many months to a few years. If needed, warts that cause problems or do not go away on their own can be treated. What are the causes? Warts are caused by a type of virus called human papillomavirus (HPV). HPV can spread from person to person through direct contact. Warts can also spread to other areas of the body when a person scratches a wart and then scratches another area of his or her body. What increases the risk? You are more likely to develop this condition if: You are 45-35 years old. You have a weakened body defense system (immune system). You are Caucasian. What are the signs or symptoms? The main symptom of this condition is small growths on the skin. Warts may: Be round or oval or have an irregular shape. Have a rough surface. Range in color from skin color to light yellow, brown, or gray. Generally be less than  inch (1.3 cm) in size. Go away and then come back again. Most warts are painless, but some can be painful if they are large or occur in an area of the body where pressure is applied to them, such as the bottom of the foot. How is this diagnosed? A wart can usually be diagnosed based on its appearance. In some cases, a tissue sample may be removed (biopsy) to be looked at under a microscope. How is this treated? In many cases, warts do not need treatment. Sometimes treatment is wanted. If treatment is needed or wanted, options may include: Applying medicated solutions, creams, or patches to the wart. These may be over-the-counter or prescription medicines that make the skin soft so that layers will gradually shed  away. In many cases, the medicine is applied one or two times per day and covered with a bandage. Putting duct tape over the top of the wart (occlusion). You will leave the tape in place for as long as told by your health care provider and then replace it with a new strip of tape. This is done until the wart goes away. Freezing the wart with liquid nitrogen (cryotherapy). Burning the wart with: Laser treatment. An electrified probe (electrocautery). Injection of a medicine into the wart to help the body's immune system fight off the wart. Surgery to remove the wart. Follow these instructions at home: Medicines Use over-the-counter and prescription medicines only as told by your health care provider. Do not use over-the-counter wart medicines on your face or genitals unless your health care provider tells you to do that. Lifestyle Keep your immune system healthy. To do this: Eat a healthy, balanced diet. Get enough sleep. Do not use any products that contain nicotine or tobacco. These products include cigarettes, chewing tobacco, and vaping devices, such as e-cigarettes. If you need help quitting, ask your health care provider. General instructions  Wash your hands after you touch a wart. Do not scratch or pick at a wart. Avoid shaving hair that is over a wart. Keep all follow-up visits. This is important. Contact a health care provider if: Your warts do not improve after treatment. You have redness,  swelling, or pain at the site of a wart. You have bleeding from a wart that does not stop with light pressure. You have diabetes and you develop a wart. Summary Warts are small growths on the skin. They are common and can occur on many areas of the body. In many cases, warts do not need treatment. Sometimes treatment is wanted. If treatment is needed or wanted, there are several treatment options. Apply over-the-counter and prescription medicines only as told by your health care  provider. Wash your hands after you touch a wart. Keep all follow-up visits. This is important. This information is not intended to replace advice given to you by your health care provider. Make sure you discuss any questions you have with your health care provider. Document Revised: 02/10/2021 Document Reviewed: 02/10/2021 Elsevier Patient Education  2024 [employer].

## 2022-07-12 NOTE — Progress Notes (Signed)
Subjective:    Patient ID: Richard Salazar, male    DOB: 11-19-1960, 62 y.o.   MRN: [id number]  61y/o married established caucasian male last seen 04/05/22 for bilateral plantar warts shaved at that time and instructed to start wart treatment OTC and follow up for reshave prn.  Patient reported he bought [facility] wart pads and has been applying per instructions new pad every other day.  Sometimes falling off right heel and left midfoot and he doesn't notice until shower.  Initially felt like pebble in shoe and very painful if he steps barefoot on grout lines in tile over wart.  Spouse has been helping to shave down callous both feet since last appt and he has tried home freezing twice but warts are persisting.  Denied rash/fever/chills/bleeding/discharge/swelling.      Review of Systems  Constitutional:  Negative for chills and fever.  HENT:  Negative for congestion.   Respiratory:  Negative for cough.   Cardiovascular:  Negative for chest pain and leg swelling.  Gastrointestinal:  Negative for diarrhea, nausea and vomiting.  Musculoskeletal:  Positive for gait problem. Negative for arthralgias and myalgias.  Skin:  Positive for rash. Negative for color change and wound.  Neurological:  Negative for tremors, weakness and headaches.  Hematological:  Negative for adenopathy. Does not bruise/bleed easily.  Psychiatric/Behavioral:  Negative for sleep disturbance.        Objective:   Physical Exam General: He is awake. He is not in acute distress    Appearance: Normal appearance. He is well-developed, well-groomed and overweight. He is not ill-appearing, toxic-appearing or diaphoretic.  HENT:     Head: Normocephalic and atraumatic.     Jaw: There is normal jaw occlusion.     Salivary Glands: Right salivary gland is not diffusely enlarged. Left salivary gland is not diffusely enlarged.     Right Ear: Hearing and external ear normal.     Left Ear: Hearing and external ear normal.     Nose:  Nose normal. No congestion or rhinorrhea.     Mouth/Throat:     Lips: Pink. No lesions.     Mouth: Mucous membranes are moist.     Tongue: No lesions. Tongue does not deviate from midline.     Palate: No mass and lesions.     Pharynx: Oropharynx is clear. Uvula midline.  Eyes:     General: Lids are normal. Vision grossly intact. Gaze aligned appropriately. Allergic shiner present. No scleral icterus.       Right eye: No discharge.        Left eye: No discharge.     Extraocular Movements: Extraocular movements intact.     Right eye: Normal extraocular motion and no nystagmus.     Left eye: Normal extraocular motion and no nystagmus.     Conjunctiva/sclera: Conjunctivae normal.     Right eye: Right conjunctiva is not injected. No chemosis, exudate or hemorrhage.    Left eye: Left conjunctiva is not injected. No chemosis, exudate or hemorrhage.    Pupils: Pupils are equal, round, and reactive to light.  Neck:     Trachea: Trachea and phonation normal. No tracheal deviation.  Cardiovascular:     Rate and Rhythm: Normal rate and regular rhythm.     Pulses:          Radial pulses are 2+ on the right side and 2+ on the left side.     Heart sounds: Normal heart sounds, S1 normal and S2 normal.  Pulmonary:     Effort: Pulmonary effort is normal.     Breath sounds: Normal breath sounds and air entry. No stridor or transmitted upper airway sounds. No wheezing.     Comments: Spoke full sentences without difficulty; no cough observed in exam room Abdominal:     General: Abdomen is flat.  Musculoskeletal:        General: Normal range of motion.     Cervical back: Normal range of motion and neck supple. No edema, erythema, signs of trauma, rigidity, torticollis or crepitus. No pain with movement. Normal range of motion.     Right foot: No deformity.     Left foot: No deformity.  Feet:     Right foot:     Skin integrity: Callus present. No ulcer, blister, skin breakdown, erythema or warmth.      Toenail Condition: Right toenails are normal.     Left foot:     Skin integrity: Callus present. No ulcer, blister, skin breakdown, erythema or warmth.     Toenail Condition: Left toenails are normal.  Lymphadenopathy:     Head:     Right side of head: No submandibular or preauricular adenopathy.     Left side of head: No submandibular or preauricular adenopathy.     Cervical:     Right cervical: No superficial cervical adenopathy.    Left cervical: No superficial cervical adenopathy.  Skin:    General: Skin is warm and dry.     Capillary Refill: Capillary refill takes less than 2 seconds.     Coloration: Skin is not ashen, cyanotic, jaundiced, mottled, pale or sallow.     Findings: Lesion and rash present. No abrasion, abscess, acne, bruising, burn, ecchymosis, erythema, signs of injury, laceration, petechiae or wound. Rash is scaling.     Nails: There is no clubbing.     Comments: mild to moderate callous heels/overlying MTP joints bilaterally dry warm pink; punctate left 2nd MTP with increased callous nummular 7mm diameter; right calcalneous punctate with 6mm diameter nummular callous  Neurological:     General: No focal deficit present.     Mental Status: He is alert and oriented to person, place, and time. Mental status is at baseline.     GCS: GCS eye subscore is 4. GCS verbal subscore is 5. GCS motor subscore is 6.     Cranial Nerves: Cranial nerves 2-12 are intact. No cranial nerve deficit, dysarthria or facial asymmetry.     Motor: No tremor, weakness, atrophy, abnormal muscle tone or seizure activity.     Coordination: Coordination is intact. Coordination normal.     Gait: Gait is intact. Gait normal.     Comments: In/out of chair without difficulty; gait sure and steady in clinic; bilateral hand grasp equal 5/5 Psychiatric:        Attention and Perception: Attention and perception normal.        Mood and Affect: Mood and affect normal.        Speech: Speech normal.         Behavior: Behavior normal. Behavior is cooperative.        Thought Content: Thought content normal.        Cognition and Memory: Cognition and memory normal.        Judgment: Judgment normal.          Verbal consent obtained.  Verified no iodine or rubbing alcohol allergy.  Iodine tincture applied bilateral feet and wiped with alcohol wipe prior to shaving  with #10 blade.  Procedure stopped when patient reported too tender.  Scant capillary bleeding center of wart punctates less than 1ml EBL; no discharge noted where callous removed. Discussed patient to do home liquid nitrogen freeze 3-5 seconds both warts after work today prior to bed time.         Assessment & Plan:   A-plantar warts bilateral subsequent visit   P-Notified patient size decreasing and callous/dry skin much improved since last visit.  Treated with shaving callous with #10 blade until slight tenderness scant bleeding.  Applied neosporin generic and bandaids 1 each foot over shaved wart clean dry and intact bleeding stopped before putting on socks/shoes. Do home cryotherapy tonight 3-5 seconds each wart.  Patient reported minimal pain after procedure.  Stated less pain with weight bearing after procedure. Discussed with patient to keep clean and dry and follow up in 20 days for reshave as I am on vacation until 9 Jul next day in clinic.  Cryotherapy not available in [facility] clinic.  PCM may have available.  Discussed with patient 2-3  treatments may be required to eradicate wart. Exitcare handout on plantar warts. Patient notified to expect scab at treated area but if erythema, purulent drainage, red streak radiating up hand/foot to return to clinic for reevaluation. Patient agreed with plan of care, verbalized understanding of instructions/information and had no further questions at this time.

## 2022-08-15 ENCOUNTER — Other Ambulatory Visit: Payer: Self-pay

## 2022-08-15 NOTE — Telephone Encounter (Signed)
Patient reported after last shave 07/12/22 minimal pain/discomfort no further needs at this time.  Gait sure and steady wearing sneakers with socks today

## 2022-08-18 NOTE — Progress Notes (Signed)
My chart message sent to patient and left message at work   Richard Salazar, Richard Salazar said you no showed 7/24 appt for Hgba1c repeat.  Have you had 3 months of diet and exercise changes?  If not we will reschedule for 3 months after these changes.  Also are you still taking any heartburn medication on a regular basis e.g. prilosec/nexium/omeprazole?  If not we do not need to draw a magnesium level.  Sincerely,  Albina Billet NP-C  RN [name] met with patient 05/30/22 reviewed results/results note with patient and gave printed copy of results/notes.  Patient was scheduled for Hgba1c and magnesium follow up 08/15/22 but no showed.  Met requirements for [facility] Well insurance discount NP completed provider paperwork 05/31/22 RN [name] notified HR team patient met discount requirements. LDL 100 Hgba1c 6.1 worsening BP 138/80 weight 204lbs had 4 lb weight loss since last Be Well and BP slightly improved from 126/91 previous [facility] Well  Had encouraged patient during office visits to increase activity level and decrease added sugars in diet

## 2022-08-20 ENCOUNTER — Other Ambulatory Visit: Payer: Self-pay | Admitting: Registered Nurse

## 2022-08-20 DIAGNOSIS — R7303 Prediabetes: Secondary | ICD-10-CM

## 2022-08-20 NOTE — Progress Notes (Signed)
Patient reported has increased exercise but not made dietary changes since last Hgba1c.  I have been doing some evening exercising (chair yoga basically) since about early mid-May, about 20-30 minutes, lots of reps fairly quickly.    I miss a day here or there.      Prior to that, very limited exercise."  Discussed with patient will rescheduled Hgba1c to mid August with RN [name] nonfasting so 3 months of activity changes prior to recheck.  Discussion 08/20/22.  No longer taking heartburn medication discussed magnesium lab would be cancelled.

## 2022-09-12 NOTE — Progress Notes (Signed)
Spoke with patient scheduled Hgba1c recheck for 09/19/22 nonfasting draw.  Patient eating fish and cauliflower for lunch today.  Has increased exercise and tried to make more consistently healthy dietary choices since last discussion.

## 2022-09-19 ENCOUNTER — Other Ambulatory Visit: Payer: Self-pay

## 2022-09-20 ENCOUNTER — Other Ambulatory Visit: Payer: Self-pay | Admitting: Registered Nurse

## 2022-09-20 ENCOUNTER — Other Ambulatory Visit: Payer: Self-pay

## 2022-09-20 NOTE — Progress Notes (Signed)
Client in clinic today for recheck A1C lab draw.  Blood pressure 140/84, pulse 66, pO2 98% and weight 200 lb.  Has been trying to lose a bit and watching carbs. Blood draw done for A1C and tolerated well.  Reece Packer, RN, BSN, MPH, COHN-S

## 2022-09-21 LAB — HEMOGLOBIN A1C
Est. average glucose Bld gHb Est-mCnc: 117 mg/dL
Hgb A1c MFr Bld: 5.7 % — ABNORMAL HIGH (ref 4.8–5.6)

## 2022-09-22 ENCOUNTER — Encounter: Payer: Self-pay | Admitting: Registered Nurse

## 2022-09-22 ENCOUNTER — Telehealth: Payer: Self-pay | Admitting: Registered Nurse

## 2022-09-22 DIAGNOSIS — Z0000 Encounter for general adult medical examination without abnormal findings: Secondary | ICD-10-CM

## 2022-09-22 DIAGNOSIS — R7303 Prediabetes: Secondary | ICD-10-CM

## 2022-09-22 NOTE — Telephone Encounter (Signed)
Hemoglobin A1c  5.7 High % 4.8 - 5.6  Please Note:                                                         .          Prediabetes: 5.7 - 6.4          Diabetes: >6.4          Glycemic control for adults with diabetes: <7.0  Estim. Avg Glu (eAG)  117  mg/dL  04/30/8117 from [facility] portal  My chart message sent to patient  Herby,  Your 3 month blood sugar average improved from 6.1 and an average daily blood sugar of 128 to 5.7 and 117.  Keep up the great work!  You have achieved level where you were in May 2023.  Try to keep up activity after meals/snacks and health dietary choices and 10lb weight loss over the next year.  Increasing your fruit and vegetable intake just one serving per day has shown to have health benefits.  I recommend next recheck Apr 2025 with your Be Well labs.  Consider 10" diameter plate method e.g. 1/2 plate nonstarchy vegetables e.g. broccoli, green beans, cauliflower, leafy greens; 1/4 breads/rice/carbohydrates/starchy vegetable e.g. corn/peas and 1/4 protein e.g. meat/nuts/beans/fish/dairy for each meal.    Please let me know if you have further questions.  Sincerely,  Albina Billet NP-C

## 2022-11-29 ENCOUNTER — Encounter: Payer: Self-pay | Admitting: Registered Nurse

## 2022-11-29 ENCOUNTER — Telehealth: Payer: Self-pay | Admitting: Registered Nurse

## 2022-11-29 DIAGNOSIS — M6208 Separation of muscle (nontraumatic), other site: Secondary | ICD-10-CM

## 2022-11-29 NOTE — Telephone Encounter (Signed)
Foot warts resolved.  Feels like diastasis recti worsening evening though waistband pants looser.  Has been exercising 30 minutes nightly most days of the week and eating healthier.  Discussed core strengthening with patient and increasing protein stop ab crunches and do other core strengthening exercises handout sent via my chart to patient and information on diastasis recti and protein in foods recommended 1.6g per kg body weight 90Kg = 144 grams protein intake per 24 hours between meals and snacks.  Patient asked about tart cherry juice discussed 4-8 oz per day recommended may continue ensure no sugar added on nutrition label.  Encouraged patient to continue activity hydration with water and healthy diet.  Congratulated him on his weight loss and healthy living choices.  Patient agreed with plan of care and had no further questions at this time.  Denied abdomen pain/constipation/n/v/d.

## 2023-01-03 ENCOUNTER — Encounter: Payer: Self-pay | Admitting: Registered Nurse

## 2023-01-03 ENCOUNTER — Ambulatory Visit: Payer: No Typology Code available for payment source | Admitting: Registered Nurse

## 2023-01-03 VITALS — BP 142/80 | HR 73 | Temp 98.0°F

## 2023-01-03 DIAGNOSIS — H0289 Other specified disorders of eyelid: Secondary | ICD-10-CM

## 2023-01-03 MED ORDER — CARBOXYMETHYLCELLULOSE SOD PF 0.5 % OP SOLN
1.0000 [drp] | Freq: Three times a day (TID) | OPHTHALMIC | Status: AC | PRN
Start: 2023-01-03 — End: 2023-01-17

## 2023-01-03 MED ORDER — ERYTHROMYCIN 5 MG/GM OP OINT: 1.0000 | TOPICAL_OINTMENT | Freq: Two times a day (BID) | OPHTHALMIC | 0 refills | Status: AC

## 2023-01-03 NOTE — Patient Instructions (Signed)
Blepharitis Blepharitis refers to inflammation of the eyelids. It is a common condition and can cause dryness or grittiness in the eyes. Other symptoms may include: Reddish, scaly skin around the scalp and eyebrows. Burning or itching of the eyelids. Eye discharge at night that causes the eyelashes to stick together in the morning. Eyelashes that fall out. Redness of the eyes. Sensitivity to light. Follow these instructions at home: Pay attention to any changes in how your eyes look or feel. Tell your health care provider about any changes. Follow these instructions to help with your condition. Keeping clean Wash your hands often with soap and water for at least 20 seconds. Clean your eyes and wash the edges of your eyelids with diluted baby shampoo or commercial eyelid wipes. Do this 2 or more times a day. Wash your face and eyebrows at least once a day. Use a clean towel each time you dry your eyelids. Do not use this towel to clean or dry other areas of your body. Do not share your towel with anyone. General instructions Avoid wearing makeup until you get better. Do not share makeup with anyone. Avoid rubbing your eyes. Use warm compresses on the eyes for 5-10 minutes. Do this 1 or 2 times a day, or as told by your health care provider. You can use warm water on a towel, but a microwaveable heating pad often stays warm longer. The pad should be very warm but not hot enough to burn the skin. If you were prescribed an antibiotic ointment or steroid drops, apply or use the medicine as told by your health care provider. Do not stop using the medicine even if you feel better. Keep all follow-up visits. This is important. Contact a health care provider if: Your eyelids feel hot. You have blisters or a rash on your eyelids. The inflammation gets worse or does not go away in 2-4 days. Get help right away if: You have pain or redness that gets worse or spreads to other parts of your face. Your  vision changes. You have pain when looking at lights or moving objects. You have a fever. Summary Blepharitis refers to inflammation of the eyelids. It can cause dryness and grittiness in the eyes. Pay attention to any changes in how your eyes look or feel. Tell your health care provider about any changes. Follow home care instructions as told by your health care provider. Wash your hands often with soap and water for at least 20 seconds. Avoid wearing makeup. Do not rub your eyes. If you were prescribed an antibiotic ointment or steroid drops, apply or use the medicine as told by your health care provider. Get help right away if you have a fever, vision changes, pain or redness that gets worse or spreads to other parts of your face, or pain when looking at lights or moving objects. This information is not intended to replace advice given to you by your health care provider. Make sure you discuss any questions you have with your health care provider. Document Revised: 02/10/2020 Document Reviewed: 02/10/2020 Elsevier Patient Education  2024 [employer] Viral Conjunctivitis, Adult  Viral conjunctivitis is an inflammation of the conjunctiva. The conjunctiva is the clear membrane that covers the white part of the eye and the inner surface of the eyelid. The inflammation is caused by a viral infection. The blood vessels in the conjunctiva become large, causing the eye to become red or pink and often itchy and tearing. The inflammation usually starts in one  eye and goes to the other in a day or two. Infections usually go away over 1-2 weeks. Viral conjunctivitis is contagious. This means it can be easily passed from one person to another. This condition is often called pink eye. What are the causes? This condition is caused by a virus. It can be spread by touching objects that have been contaminated with the virus, such as doorknobs or towels, and then touching your eye. It can also be passed through  tiny droplets, such as from coughing or sneezing. What increases the risk? You are more likely to develop this condition if you have a cold or the flu, or are in close contact with a person who has pink eye. What are the signs or symptoms? Symptoms of this condition include: Redness in the eye. Tearing or watery eyes. Itchy and irritated eyes. Burning feeling in the eyes. Clear drainage from the eye. Swollen eyelids. A gritty feeling in the eye. Light sensitivity. This condition often occurs with other symptoms, such as nasal congestion, cough, and fever. How is this diagnosed? This condition is diagnosed with a medical history and physical exam. If you have discharge from your eye, the discharge may be tested for a virus or to rule out other causes of conjunctivitis. How is this treated? Viral conjunctivitis does not respond to medicines that kill bacteria (antibiotics). The condition most often goes away on its own in 1-2 weeks. If treatment is needed, it is aimed at relieving your symptoms and preventing the spread of infection. This may be done with artificial tear drops, antihistamine drops, or other eye medicines. In rare cases, steroid eye drops or anti-herpes virus medicines may be prescribed. Follow these instructions at home: Medicines  Take or apply over-the-counter and prescription medicines only as told by your health care provider. Do not touch the edge of the eyelid with the eye-drop bottle or ointment tube when applying medicines to the affected eye. This will prevent the spread of the infection to the other eye or to other people. Eye care Avoid touching or rubbing your eyes. Apply a clean, cool, wet washcloth onto your eye for 10-20 minutes, 3-4 times per day, or as told by your health care provider. If you wear contact lenses, do notwear them until the inflammation is gone and your health care provider says it is safe to wear them again. Ask your health care provider how  to disinfect or replace your contact lenses before using them again. Wear glasses until you can resume wearing contacts. Avoid wearing eye makeup until the inflammation is gone. Throw away any old eye cosmetics that may be contaminated. Gently wipe away any crusting from your eye with a wet washcloth or a cotton ball. General instructions Change or wash your pillowcase every day or as told by your health care provider. Do not share towels, pillowcases, washcloths, eye makeup, makeup brushes, eye drops, contact lenses, or eyeglasses. This may spread the infection. Wash your hands often with soap and water. Use paper towels to dry your hands. If soap and water are not available, use hand sanitizer. Avoid contact with other people until your eye is no longer red and tearing, or as told by your health care provider. Keep all follow-up visits. Contact a health care provider if: Your symptoms do not improve with treatment, or they get worse. You have increased pain. Your vision becomes blurry. You have a fever. You have facial pain, redness, or swelling. You have yellow or green drainage coming  from your eye. You have new symptoms. Get help right away if: You develop severe pain. Your vision gets much worse. Summary Viral conjunctivitis is an inflammation of the conjunctiva. It usually goes away in 1-2 weeks. The condition is caused by a virus and is spread by touching contaminated objects or breathing in droplets from a cough or a sneeze. This condition is usually treated with medicines and cold compresses to relieve the symptoms. Because it is caused by a virus, it should not be treated with antibiotics. This condition is very contagious. To prevent infection, avoid close contact with others, wash your hands often, and do not share towels or washcloths. Contact a health care provider if your symptoms do not go away with treatment, or if you have blurry vision, facial swelling, or increased  pain. This information is not intended to replace advice given to you by your health care provider. Make sure you discuss any questions you have with your health care provider. Document Revised: 02/15/2021 Document Reviewed: 02/15/2021 Elsevier Patient Education  2024 [employer] Bacterial Conjunctivitis, Adult Bacterial conjunctivitis is an infection of the clear membrane that covers the white part of the eye and the inner surface of the eyelid (conjunctiva). When the blood vessels in the conjunctiva become inflamed, the eye becomes red or pink. The eye often feels irritated or itchy. Bacterial conjunctivitis spreads easily from person to person (is contagious). It also spreads easily from one eye to the other eye. What are the causes? This condition is caused by bacteria. You may get the infection if you come into close contact with: A person who is infected with the bacteria. Items that are contaminated with the bacteria, such as a face towel, contact lens solution, or eye makeup. What increases the risk? You are more likely to develop this condition if: You are exposed to other people who have the infection. You wear contact lenses. You have a sinus infection. You have had a recent eye injury or surgery. You have a weak body defense system (immune system). You have a medical condition that causes dry eyes. What are the signs or symptoms? Symptoms of this condition include: Thick, yellowish discharge from the eye. This may turn into a crust on the eyelid overnight and cause your eyelids to stick together. Tearing or watery eyes. Itchy eyes. Burning feeling in your eyes. Eye redness. Swollen eyelids. Blurred vision. How is this diagnosed? This condition is diagnosed based on your symptoms and medical history. Your health care provider may also take a sample of discharge from your eye to find the cause of your infection. How is this treated? This condition may be treated  with: Antibiotic eye drops or ointment to clear the infection more quickly and prevent the spread of infection to others. Antibiotic medicines taken by mouth (orally) to treat infections that do not respond to drops or ointments or that last longer than 10 days. Cool, wet cloths (cool compresses) placed on the eyes. Artificial tears applied 2-6 times a day. Follow these instructions at home: Medicines Take or apply your antibiotic medicine as told by your health care provider. Do not stop using the antibiotic, even if your condition improves, unless directed by your health care provider. Take or apply over-the-counter and prescription medicines only as told by your health care provider. Be very careful to avoid touching the edge of your eyelid with the eye-drop bottle or the ointment tube when you apply medicines to the affected eye. This will keep you from  spreading the infection to your other eye or to other people. Managing discomfort Gently wipe away any drainage from your eye with a warm, wet washcloth or a cotton ball. Apply a clean, cool compress to your eye for 10-20 minutes, 3-4 times a day. General instructions Do not wear contact lenses until the inflammation is gone and your health care provider says it is safe to wear them again. Ask your health care provider how to sterilize or replace your contact lenses before you use them again. Wear glasses until you can resume wearing contact lenses. Avoid wearing eye makeup until the inflammation is gone. Throw away any old eye cosmetics that may be contaminated. Change or wash your pillowcase every day. Do not share towels or washcloths. This may spread the infection. Wash your hands often with soap and water for at least 20 seconds and especially before touching your face or eyes. Use paper towels to dry your hands. Avoid touching or rubbing your eyes. Do not drive or use heavy machinery if your vision is blurred. Contact a health care  provider if: You have a fever. Your symptoms do not get better after 10 days. Get help right away if: You have a fever and your symptoms suddenly get worse. You have severe pain when you move your eye. You have facial pain, redness, or swelling. You have a sudden loss of vision. Summary Bacterial conjunctivitis is an infection of the clear membrane that covers the white part of the eye and the inner surface of the eyelid (conjunctiva). Bacterial conjunctivitis spreads easily from eye to eye and from person to person (is contagious). Wash your hands often with soap and water for at least 20 seconds and especially before touching your face or eyes. Use paper towels to dry your hands. Take or apply your antibiotic medicine as told by your health care provider. Do not stop using the antibiotic even if your condition improves. Contact a health care provider if you have a fever or if your symptoms do not get better after 10 days. Get help right away if you have a sudden loss of vision. This information is not intended to replace advice given to you by your health care provider. Make sure you discuss any questions you have with your health care provider. Document Revised: 04/20/2020 Document Reviewed: 04/20/2020 Elsevier Patient Education  2024 [employer].

## 2023-01-03 NOTE — Progress Notes (Signed)
Subjective:    Patient ID: Richard Salazar, male    DOB: [birth year], 62 y.o.   MRN: [id number]  62y/o caucasian married male established patient here for evaluation left eye pain has tried warm compresses and helps some temporarily but worsening pain over the last couple of days now feels like stabbing instead of just dust in eye.  Denied matting shut, visual changes, headache, or discharge, fever/chills.  Saw optometry a couple months ago and stated could have eyelid scraped but decided to wait on that treatment and now pain worsening.      Review of Systems  Constitutional:  Negative for chills and fever.  HENT:  Negative for congestion, postnasal drip, trouble swallowing and voice change.   Eyes:  Positive for pain, redness and itching. Negative for photophobia, discharge and visual disturbance.  Respiratory:  Negative for cough.   Gastrointestinal:  Negative for nausea and vomiting.  Musculoskeletal:  Negative for gait problem, neck pain and neck stiffness.  Skin:  Positive for color change. Negative for pallor, rash and wound.  Neurological:  Negative for dizziness, tremors, seizures, syncope, facial asymmetry, speech difficulty, weakness, light-headedness, numbness and headaches.  Hematological:  Negative for adenopathy. Does not bruise/bleed easily.  Psychiatric/Behavioral:  Negative for agitation, confusion and sleep disturbance.        Objective:   Physical Exam Vitals and nursing note reviewed.  Constitutional:      General: He is awake. He is not in acute distress.    Appearance: Normal appearance. He is well-developed, well-groomed and overweight. He is not ill-appearing, toxic-appearing or diaphoretic.  HENT:     Head: Normocephalic. Abrasion present. No raccoon eyes, Battle's sign, contusion, right periorbital erythema, left periorbital erythema or laceration.     Jaw: There is normal jaw occlusion. No trismus, tenderness, swelling, pain on movement or malocclusion.      Salivary Glands: Right salivary gland is not diffusely enlarged or tender. Left salivary gland is not diffusely enlarged or tender.      Right Ear: Hearing and external ear normal. No decreased hearing noted.     Left Ear: Hearing and external ear normal. No decreased hearing noted.     Nose: Nose normal. No congestion or rhinorrhea.     Right Nostril: No epistaxis.     Left Nostril: No epistaxis.     Right Turbinates: Not enlarged, swollen or pale.     Left Turbinates: Not enlarged, swollen or pale.     Right Sinus: No maxillary sinus tenderness or frontal sinus tenderness.     Left Sinus: No maxillary sinus tenderness or frontal sinus tenderness.     Mouth/Throat:     Lips: Pink. No lesions.     Mouth: Mucous membranes are moist. No oral lesions or angioedema.     Dentition: No gum lesions.     Tongue: No lesions. Tongue does not deviate from midline.     Palate: No mass and lesions.     Pharynx: Uvula midline. Pharyngeal swelling and postnasal drip present. No oropharyngeal exudate, posterior oropharyngeal erythema or uvula swelling.     Tonsils: No tonsillar exudate or tonsillar abscesses.     Comments: Bilateral allergic shiners; cobblestoning posterior pharynx Eyes:     General: Lids are normal. Lids are everted, no foreign bodies appreciated. Vision grossly intact. Gaze aligned appropriately. Allergic shiner present. No visual field deficit or scleral icterus.       Right eye: No foreign body, discharge or hordeolum.  Left eye: No foreign body, discharge or hordeolum.     Extraocular Movements: Extraocular movements intact.     Right eye: Normal extraocular motion and no nystagmus.     Left eye: Normal extraocular motion and no nystagmus.     Conjunctiva/sclera:     Right eye: Right conjunctiva is injected. No chemosis, exudate or hemorrhage.    Left eye: Left conjunctiva is injected. No chemosis, exudate or hemorrhage.    Pupils: Pupils are equal, round, and reactive to  light.     Comments: Eyelid conjunctiva 2-3+/4 injection left 1+/4 right; bulbar 1+/4 bilaterally  Neck:     Trachea: Trachea and phonation normal. No abnormal tracheal secretions.  Cardiovascular:     Rate and Rhythm: Normal rate and regular rhythm.     Pulses:          Radial pulses are 2+ on the right side and 2+ on the left side.  Pulmonary:     Effort: Pulmonary effort is normal.     Breath sounds: Normal breath sounds and air entry. No stridor or transmitted upper airway sounds. No decreased breath sounds or wheezing.     Comments: Spoke full sentences without difficulty; no cough observed in exam room Abdominal:     Palpations: Abdomen is soft.  Musculoskeletal:        General: Normal range of motion.     Right hand: Normal strength. Normal capillary refill.     Left hand: Normal strength. Normal capillary refill.     Cervical back: Normal range of motion and neck supple. No swelling, edema, deformity, erythema, signs of trauma, lacerations, rigidity, spasms, torticollis, tenderness or crepitus. No pain with movement. Normal range of motion.     Thoracic back: No swelling, edema, deformity, signs of trauma, lacerations or tenderness.     Right lower leg: No edema.     Left lower leg: No edema.  Lymphadenopathy:     Head:     Right side of head: No submental, submandibular, tonsillar, preauricular, posterior auricular or occipital adenopathy.     Left side of head: No submental, submandibular, tonsillar, preauricular, posterior auricular or occipital adenopathy.     Cervical: No cervical adenopathy.     Right cervical: No superficial, deep or posterior cervical adenopathy.    Left cervical: No superficial, deep or posterior cervical adenopathy.  Skin:    General: Skin is warm and dry.     Capillary Refill: Capillary refill takes less than 2 seconds.     Coloration: Skin is not ashen, cyanotic, jaundiced, mottled, pale or sallow.     Findings: Erythema and rash present. No  abrasion, abscess, acne, bruising, burn, ecchymosis, signs of injury, laceration, lesion, petechiae or wound. Rash is macular. Rash is not crusting, nodular, papular, purpuric, pustular, scaling, urticarial or vesicular.     Nails: There is no clubbing.     Comments: Patient observed rubbing left eye in exam room macular erythema and mild nonpitting edema 0-1+/4 left upper eyelid ; pustule pinpoint noted bulbar surface central upper eyelid when lid everted location adjacent to superior border of globe  Neurological:     General: No focal deficit present.     Mental Status: He is alert and oriented to person, place, and time. Mental status is at baseline.     GCS: GCS eye subscore is 4. GCS verbal subscore is 5. GCS motor subscore is 6.     Cranial Nerves: Cranial nerves 2-12 are intact. No cranial nerve deficit,  dysarthria or facial asymmetry.     Motor: Motor function is intact. No weakness, tremor, atrophy, abnormal muscle tone or seizure activity.     Coordination: Coordination is intact. Coordination normal.     Gait: Gait is intact. Gait normal.     Comments: In/out of chair and on/off exam table without difficulty; gait sure and steady in clinic; bilateral hand grasp equal 5/5  Psychiatric:        Attention and Perception: Attention and perception normal.        Mood and Affect: Mood and affect normal.        Speech: Speech normal.        Behavior: Behavior normal. Behavior is cooperative.        Thought Content: Thought content normal.        Cognition and Memory: Cognition and memory normal.        Judgment: Judgment normal.           Assessment & Plan:  A-left eye pain initial visit  P-Discussed no foreign object/stye/hordeolum/chalazion noted on exam today.  Cleared for work Presenter, broadcasting discussed. Does not wear contacts. Avoid rubbing/scratching eye.  May take zyrtec 10mg  po BID prn itching or use ketotifen OTC eye drops 1 gtt ou bid.   Glasses today. Patient to apply warm or cool  packs prn left eye 5 minutes TID prn or warm compress.  Patient stated he feels best with eye closed and compress on.  Discussed during his work break as on [employer] during work day best to close eyes and rest.  Refresh drops 2 bilateral eyes TID x 7 days given 5 UD from clinic stock  Erythromycin ointment ophthalmic 0.5% instill 1cm ribbon left eye BID x 7 days #1 RF0 dispensed from [facility] to patient. Discussed do not touch container to eye.  Discussed blinking irritates eye blood vessels further as eyelid vessels rub on bulbar vessals. Instructed patient to not rub eyes. May need to wash pillowcases more frequently until infection resolves if discharge noted on pillow. May use over the counter eye drops/tears such as visine per manufacturer instructions for pain/symptom relief. Return to clinic if headache, fever greater than 100.57F, nausea/vomiting, purulent discharge/matting unable to open eye without using fingers after 24 hours of medication use, foreign body sensation, ciliary flush, photophobia or new vision changes. Discussed to see optometrist same day if visual field loss, visual changes or orbital swelling.  Call or return to clinic as needed if these symptoms worsen or fail to improve as anticipated. Exitcare handout on bacterial conjunctivitis, viral conjunctivitis and blepharitis. Patient verbalized agreement and understanding of treatment plan and had no further questions at this time.  P2: Hand washing, avoid contact use-wear glasses.

## 2023-04-04 ENCOUNTER — Other Ambulatory Visit: Payer: Self-pay

## 2023-06-11 ENCOUNTER — Other Ambulatory Visit: Payer: Self-pay

## 2023-06-11 VITALS — BP 148/83 | Ht 68.5 in | Wt 212.0 lb

## 2023-06-11 DIAGNOSIS — Z0000 Encounter for general adult medical examination without abnormal findings: Secondary | ICD-10-CM

## 2023-06-11 NOTE — Progress Notes (Signed)
 [facility]

## 2023-06-12 ENCOUNTER — Ambulatory Visit: Payer: Self-pay | Admitting: Registered Nurse

## 2023-06-12 DIAGNOSIS — R7303 Prediabetes: Secondary | ICD-10-CM

## 2023-06-12 LAB — CMP12+LP+TP+TSH+6AC+PSA+CBC…
ALT: 19 IU/L (ref 0–44)
AST: 23 IU/L (ref 0–40)
Albumin: 4.7 g/dL (ref 3.9–4.9)
Alkaline Phosphatase: 92 IU/L (ref 44–121)
BUN/Creatinine Ratio: 17 (ref 10–24)
BUN: 16 mg/dL (ref 8–27)
Basophils Absolute: 0 10*3/uL (ref 0.0–0.2)
Basos: 0 %
Bilirubin Total: 0.5 mg/dL (ref 0.0–1.2)
Calcium: 9.5 mg/dL (ref 8.6–10.2)
Chloride: 104 mmol/L (ref 96–106)
Chol/HDL Ratio: 3.7 ratio (ref 0.0–5.0)
Cholesterol, Total: 165 mg/dL (ref 100–199)
Creatinine, Ser: 0.92 mg/dL (ref 0.76–1.27)
EOS (ABSOLUTE): 0.1 10*3/uL (ref 0.0–0.4)
Eos: 1 %
Estimated CHD Risk: 0.6 times avg. (ref 0.0–1.0)
Free Thyroxine Index: 2 (ref 1.2–4.9)
GGT: 15 IU/L (ref 0–65)
Globulin, Total: 2.8 g/dL (ref 1.5–4.5)
Glucose: 95 mg/dL (ref 70–99)
HDL: 45 mg/dL (ref >39)
Hematocrit: 46.5 % (ref 37.5–51.0)
Hemoglobin: 15.6 g/dL (ref 13.0–17.7)
Immature Grans (Abs): 0 10*3/uL (ref 0.0–0.1)
Immature Granulocytes: 0 %
Iron: 97 ug/dL (ref 38–169)
LDH: 152 IU/L (ref 121–224)
LDL Chol Calc (NIH): 103 mg/dL — ABNORMAL HIGH (ref 0–99)
Lymphocytes Absolute: 2 10*3/uL (ref 0.7–3.1)
Lymphs: 40 %
MCH: 33.5 pg — ABNORMAL HIGH (ref 26.6–33.0)
MCHC: 33.5 g/dL (ref 31.5–35.7)
MCV: 100 fL — ABNORMAL HIGH (ref 79–97)
Monocytes Absolute: 0.6 10*3/uL (ref 0.1–0.9)
Monocytes: 12 %
Neutrophils Absolute: 2.3 10*3/uL (ref 1.4–7.0)
Neutrophils: 47 %
Phosphorus: 2.6 mg/dL — ABNORMAL LOW (ref 2.8–4.1)
Platelets: 198 10*3/uL (ref 150–450)
Potassium: 4.6 mmol/L (ref 3.5–5.2)
Prostate Specific Ag, Serum: 0.9 ng/mL (ref 0.0–4.0)
RBC: 4.66 x10E6/uL (ref 4.14–5.80)
RDW: 12.1 % (ref 11.6–15.4)
Sodium: 141 mmol/L (ref 134–144)
T3 Uptake Ratio: 31 % (ref 24–39)
T4, Total: 6.3 ug/dL (ref 4.5–12.0)
TSH: 2.23 u[IU]/mL (ref 0.450–4.500)
Total Protein: 7.5 g/dL (ref 6.0–8.5)
Triglycerides: 94 mg/dL (ref 0–149)
Uric Acid: 6.4 mg/dL (ref 3.8–8.4)
VLDL Cholesterol Cal: 17 mg/dL (ref 5–40)
WBC: 4.9 10*3/uL (ref 3.4–10.8)
eGFR: 93 mL/min/{1.73_m2} (ref >59)

## 2023-06-12 LAB — HGB A1C W/O EAG: Hgb A1c MFr Bld: 5.7 % — ABNORMAL HIGH (ref 4.8–5.6)

## 2023-09-24 ENCOUNTER — Ambulatory Visit

## 2023-09-24 ENCOUNTER — Ambulatory Visit: Payer: Self-pay | Admitting: Registered Nurse

## 2023-09-24 DIAGNOSIS — Z20822 Contact with and (suspected) exposure to covid-19: Secondary | ICD-10-CM

## 2023-09-24 DIAGNOSIS — Z139 Encounter for screening, unspecified: Secondary | ICD-10-CM

## 2023-09-24 LAB — POC COVID19 BINAXNOW: SARS Coronavirus 2 Ag: NEGATIVE

## 2023-09-24 NOTE — Progress Notes (Signed)
 Presents to clinic without any signs or symptoms. Wife tested positive for Covid today.  Per [facility], patient to be tested as well. Testing completed, awaiting results

## 2023-09-24 NOTE — Progress Notes (Signed)
 Covid test negative, patient notified

## 2023-09-25 ENCOUNTER — Encounter: Payer: Self-pay | Admitting: Registered Nurse

## 2023-09-25 MED ORDER — COVID-19 ANTIGEN TEST VI KIT: 1.0000 | PACK | Freq: Every day | 1 refills | Status: AC | PRN

## 2023-09-25 NOTE — Telephone Encounter (Signed)
 Notified by patient spouse tested positive today.  Went to clinic to have test with RN Channing negative.  If you are not having symptoms to wear mask for 10 days (Day 10 10/04/23) and monitor for symptoms e.g. sore throat, congestion, runny nose, cough, fever, chills, headache, body aches, loss of taste/smell, vomiting, diarrhea and home test on day 6 unless you start having symptoms then test day of symptoms.  If negative repeat test in 48 hours.  No eating in employee lunchroom may use tables outside or room designated by HR, outdoor tables or car during symptom monitoring period.  If positive test stay home and start quarantine until symptoms improving and no fever/vomting/diarrhea.  May drink beverages at desk/workcenter straw under mask only.  Do not remove mask when indoors at work.  Hand sanitize or wash hands after covering cough/blowing nose/eating.   I recommend frequent handwashing and wearing mask at home when around your child.  To increase airflow in the home and with cooler weather opening windows is an option now otherwise to set your HVAC to run fan with fresh air from outside more often.  Disinfecting high touch surfaces e.g. countertops, door handles, cabinet/microwave/fridge/water faucet handles daily with lysol/chlorox/bleach wipes to help protect others in household recommended.  If household positive member should isolate to designated room/bathroom until quarantine complete especially if unable to wear mask correctly/consistently.    If you do have positive test please email me a picture of your test results as I have to confirm and send to HR that I confirmed positive test results for your employer attendance policy.   If you are having symptoms but haven't tested positive for covid yet but we have high suspicion it is covid I can prescribe antivirals per new FDA guidelines.  Best to start those ASAP once symptoms begin.  There is no current preventive therapy approved by FDA other than  vaccines.  Discussed/sent CDC information below:  [email or web address].[email or web address]  last updated September 14 2020  HOME COVID-19 COVID-19 You can still develop COVID-19 up to 10 days after you have been exposed Take Precautions Wear a high-quality mask or respirator (e.g., N95) any time you are around others inside your home or indoors in public Do not go places where you are unable to wear a mask. For travel guidance, see CDC's Travel webpage. Take extra precautions if you will be around people who are more likely to get very sick from COVID-19. More about how to protect yourself and others1 ? Watch for symptoms fever (100.65F or greater) cough shortness of breath other COVID-19 symptoms If you develop symptoms isolate immediately get tested stay home until you know the result If your test result is positive, follow the isolation recommendations. ? GET TESTED Day 6 Get tested at least 5 full days after your last exposure Test even if you don't develop symptoms. If you already had COVID-19 within the past 90 days, see specific testing recommendations. ? IF YOU TEST Negative Continue taking precautions through day 10 Wear a high-quality mask when around others at home and indoors in public You can still develop COVID-19 up to 10 days after you have been exposed.  ? IF YOU TEST Positive Isolate immediately *About negative test results As noted in the Food and Drug Administration labeling for authorized over-the-counter antigen tests, negative test results do not rule out SARS-CoV-2 infection and should not be used as the sole basis for treatment or patient management decisions, including infection control decisions.   Masks are  not recommended for children under ages 2 years and younger, or for people with some disabilities.   Other prevention actions (such as improving ventilation) should be used to avoid  transmission during these 10 days.  Patient verbalized understanding information/instructions, agreed with plan of care and had no further questions at this time.
